# Patient Record
Sex: Female | Born: 1975
Health system: Southern US, Community
[De-identification: ages and names within clinical notes are randomized; demographics above are authoritative.]

## PROBLEM LIST (undated history)

## (undated) DIAGNOSIS — Z87440 Personal history of urinary (tract) infections: Secondary | ICD-10-CM

## (undated) DIAGNOSIS — E785 Hyperlipidemia, unspecified: Secondary | ICD-10-CM

## (undated) DIAGNOSIS — Z8619 Personal history of other infectious and parasitic diseases: Secondary | ICD-10-CM

## (undated) DIAGNOSIS — Z87442 Personal history of urinary calculi: Secondary | ICD-10-CM

## (undated) HISTORY — PX: NO PAST SURGERIES: SHX2092

## (undated) HISTORY — DX: Personal history of urinary (tract) infections: Z87.440

## (undated) HISTORY — DX: Personal history of urinary calculi: Z87.442

## (undated) HISTORY — DX: Personal history of other infectious and parasitic diseases: Z86.19

## (undated) HISTORY — DX: Hyperlipidemia, unspecified: E78.5

---

## 2002-11-18 ENCOUNTER — Encounter: Payer: Self-pay | Admitting: Internal Medicine

## 2002-11-18 ENCOUNTER — Ambulatory Visit (HOSPITAL_COMMUNITY): Admission: RE | Admit: 2002-11-18 | Discharge: 2002-11-18 | Payer: Self-pay | Admitting: Internal Medicine

## 2007-08-01 ENCOUNTER — Emergency Department: Payer: Self-pay | Admitting: Internal Medicine

## 2008-04-03 ENCOUNTER — Ambulatory Visit: Payer: Self-pay | Admitting: Internal Medicine

## 2011-04-01 NOTE — Progress Notes (Signed)
[facility]                  [facility]' OFFICE NOTE   NAME:FIDLER, ELLAR HAKALA                        MRN:          [id number]  DATE:04/03/2008                            DOB:          May 16, 1976    Lauren Mann was seen today at her own request because of problems with  palpitations.   She is a 35 year old soon-to-be-married mother of a 73-year-old who has a  1-year history of recurrent abrupt onset __________ tachypalpitations.  They typically last 3-5 minutes or so.  They are occurring every month  or two.  They are abrupt and offset, as well.  They are frog-negative.  They are not associated with shortness of breath.  There is some chest  discomfort with radiation to the left arm.  There is no accompanying  lightheadedness until the episodes are all over.   She does not notice that they are aggravated by caffeine, nor are they  precipitated by exertion.  She exercises a couple of times a week in the  [place] and has had no provokable arrhythmia nor any provokable chest  discomfort.   She also has a longstanding history of another palpitation syndrome  where she felt like her heart was either having skips or extra beats.  These were clearly aggravated by the use of decongestants and caffeine.   As noted, she has no baseline exercise intolerance.   She denies syncope, peripheral edema, nocturnal dyspnea or orthopnea.   She has no history of hypertension.   PAST MEDICAL HISTORY:  Largely negative.   SOCIAL HISTORY:  As noted above.  She smokes a pack or so of cigarettes  per day.  She has tried the electronic cigarette stopper which worked  for while.  Unfortunately, her fiance smokes two to three packs per day.  She uses alcohol occasionally.  She denies the use of recreational  drugs, and she has not been using caffeine.   She works in Clinical biochemist at [employer].   CURRENT MEDICATIONS:  1. Ibuprofen.  2. Tylenol.   ALLERGIES:  She has no known drug allergies.   PHYSICAL EXAMINATION:  GENERAL:  On examination, she is a healthy-  appearing young Caucasian female appearing her stated age of 35.  VITAL SIGNS:  Her blood pressure today was recorded, is now misplaced,  but was normal.  Her pulse was 75, respirations were 16 and she was in  no acute distress.  HEENT:  No icterus or xanthoma.  NECK:  The neck veins were flat.  The carotids were brisk and full  bilaterally without bruits.  BACK:  The back was without kyphosis or scoliosis.  LUNGS:  Clear.  HEART:  Heart sounds were regular with a broadly split S2 that did move.  There were no significant murmurs.  ABDOMEN:  The abdomen was soft with active bowel sounds without midline  pulsation or hepatomegaly.  EXTREMITIES:  Femoral pulses were 2+.  Distal pulses were intact.  There  was no clubbing, cyanosis or edema.  NEUROLOGIC:  Grossly normal.  SKIN:  Warm and dry.   ELECTROCARDIOGRAM:  The electrocardiogram today demonstrated sinus  rhythm  at 72 with intervals of 0.15/0.07/0.37.  There was no evidence of  ventricular pre-excitation.  There were some prominent R-waves, but not  sufficient to make a diagnosis of left ventricular hypertrophy.   IMPRESSION:  1. Tachypalpitations, likely supraventricular tachycardia.  2. Palpitations were likely consistent with PVCs/PACs, aggravated by      stimulants.  3. Cigarette abuse.   Ms. Majel Homer has palpitation syndromes that are likely as described above.  We discussed spontaneous maneuvers to try to augment the rapid  termination of these spells.  They have caused a minimum of significant  untoward symptoms, and, as such, she would like not to pursue drug  therapy or catheter ablation therapy, both of which we discussed  briefly.  I would concur.   As it relates to the palpitations, which are likely premature beats,  albeit more atrial than ventricle, these are vastly diminished since she  has  eliminated intake of stimulants either in the form of  medications  or coffee.   We also discussed smoking cessation.  I have encouraged her in this.  I  gave her the number for the 1-800-STOP-NOW program for both her and her  fiance.   At her request, we will see her again in six months' time.     Duke Salvia, MD, [facility]  Electronically Signed    SCK/MedQ  DD: 04/03/2008  DT: 04/03/2008  Job #: [id number]

## 2011-07-01 ENCOUNTER — Ambulatory Visit: Payer: Self-pay | Admitting: Obstetrics and Gynecology

## 2012-03-23 IMAGING — RF DG HYSTEROSALPINOGRAM W/ INJ
1 series · 1 of 1 positions shown · non-contrast
Comparison: none

REASON FOR EXAM: infertilty
COMMENTS:

[Series 1: run · 1 of 1 slices shown]
[im 1/1]
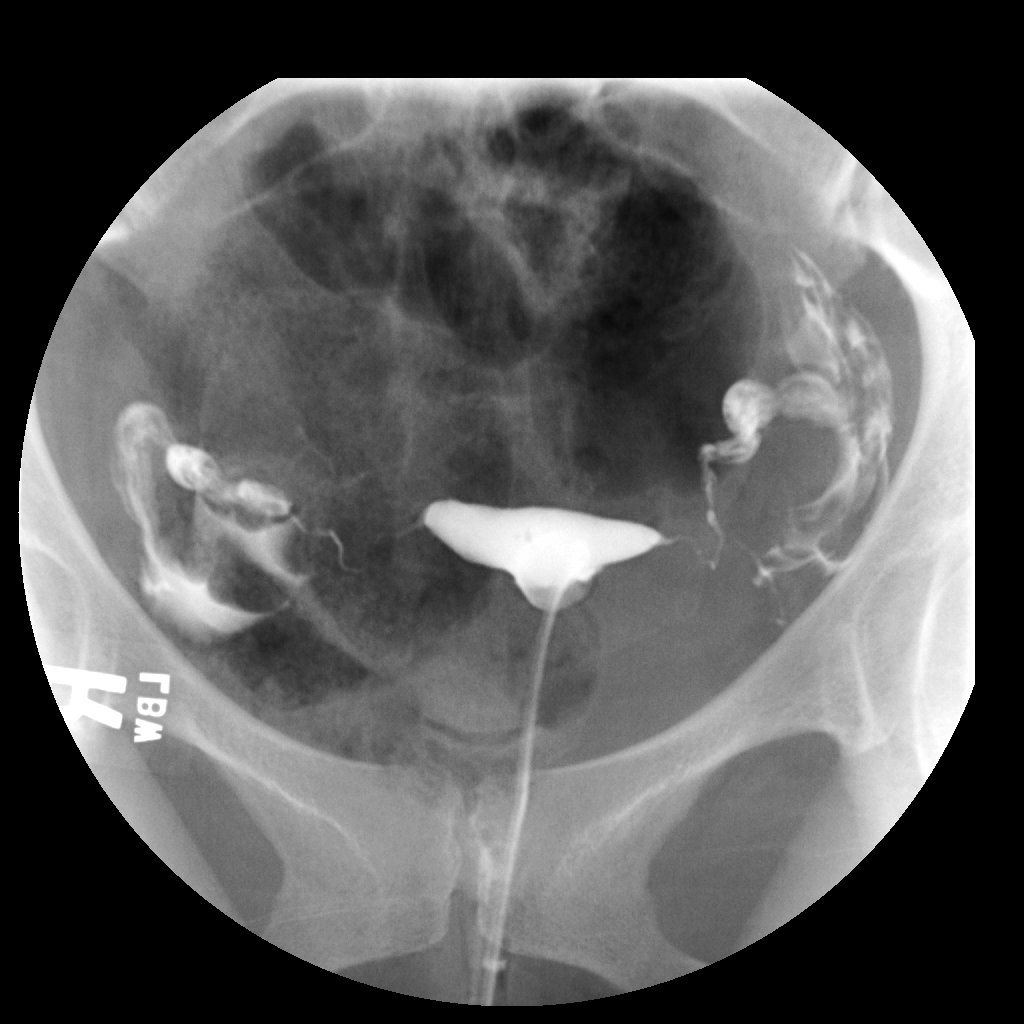

[1 of 1 positions shown; findings below may reference images not displayed]

PROCEDURE:     FL  - FL HYSTEROSALPINGOGRAM  - July 01, 2011  [DATE]

RESULT:     The patient underwent hysterosalpingography under the direction
of Dr. Ka Chon. The endocervical canal was cannulated by Dr. Ka Chon. Under
fluoroscopic guidance Asovue-[id number] was injected into the uterine cavity. There
is free spillage of contrast from normal appearing fallopian tubes. The
contour of the uterine cavity is normal.
IMPRESSION: Normal hysterosalpingogram. Further interpretation is
deferred to Dr. Ka Chon.

## 2014-12-01 ENCOUNTER — Inpatient Hospital Stay: Payer: Self-pay | Admitting: Obstetrics & Gynecology

## 2014-12-01 LAB — CBC WITH DIFFERENTIAL/PLATELET
Basophil #: 0 10*3/uL (ref 0.0–0.1)
Basophil %: 0.2 %
Eosinophil #: 0.1 10*3/uL (ref 0.0–0.7)
Eosinophil %: 1.3 %
HCT: 35.7 % (ref 35.0–47.0)
HGB: 11.7 g/dL — ABNORMAL LOW (ref 12.0–16.0)
LYMPHS PCT: 17.8 %
Lymphocyte #: 1.7 10*3/uL (ref 1.0–3.6)
MCH: 29.8 pg (ref 26.0–34.0)
MCHC: 32.8 g/dL (ref 32.0–36.0)
MCV: 91 fL (ref 80–100)
Monocyte #: 0.6 x10 3/mm (ref 0.2–0.9)
Monocyte %: 5.9 %
Neutrophil #: 7.2 10*3/uL — ABNORMAL HIGH (ref 1.4–6.5)
Neutrophil %: 74.8 %
Platelet: 201 10*3/uL (ref 150–440)
RBC: 3.93 10*6/uL (ref 3.80–5.20)
RDW: 19.7 % — ABNORMAL HIGH (ref 11.5–14.5)
WBC: 9.7 10*3/uL (ref 3.6–11.0)

## 2014-12-01 LAB — COMPREHENSIVE METABOLIC PANEL
Albumin: 2.8 g/dL — ABNORMAL LOW (ref 3.4–5.0)
Alkaline Phosphatase: 117 U/L — ABNORMAL HIGH
Anion Gap: 10 (ref 7–16)
BUN: 7 mg/dL (ref 7–18)
Bilirubin,Total: 0.3 mg/dL (ref 0.2–1.0)
CALCIUM: 9.3 mg/dL (ref 8.5–10.1)
Chloride: 105 mmol/L (ref 98–107)
Co2: 24 mmol/L (ref 21–32)
Creatinine: 0.62 mg/dL (ref 0.60–1.30)
EGFR (African American): >60
EGFR (Non-African Amer.): >60
GLUCOSE: 67 mg/dL (ref 65–99)
Osmolality: 274 (ref 275–301)
Potassium: 4.4 mmol/L (ref 3.5–5.1)
SGOT(AST): 24 U/L (ref 15–37)
SGPT (ALT): 15 U/L
Sodium: 139 mmol/L (ref 136–145)
TOTAL PROTEIN: 7.1 g/dL (ref 6.4–8.2)

## 2014-12-01 LAB — GC/CHLAMYDIA PROBE AMP

## 2014-12-02 LAB — COMPREHENSIVE METABOLIC PANEL
ALBUMIN: 2.6 g/dL — AB (ref 3.4–5.0)
ANION GAP: 13 (ref 7–16)
Alkaline Phosphatase: 118 U/L — ABNORMAL HIGH
BILIRUBIN TOTAL: 0.3 mg/dL (ref 0.2–1.0)
BUN: 8 mg/dL (ref 7–18)
Calcium, Total: 7.2 mg/dL — ABNORMAL LOW (ref 8.5–10.1)
Chloride: 104 mmol/L (ref 98–107)
Co2: 19 mmol/L — ABNORMAL LOW (ref 21–32)
Creatinine: 0.68 mg/dL (ref 0.60–1.30)
EGFR (African American): >60
EGFR (Non-African Amer.): >60
Glucose: 85 mg/dL (ref 65–99)
Osmolality: 270 (ref 275–301)
POTASSIUM: 3.3 mmol/L — AB (ref 3.5–5.1)
SGOT(AST): 25 U/L (ref 15–37)
SGPT (ALT): 16 U/L
SODIUM: 136 mmol/L (ref 136–145)
Total Protein: 6.8 g/dL (ref 6.4–8.2)

## 2014-12-02 LAB — CBC WITH DIFFERENTIAL/PLATELET
Basophil #: 0 10*3/uL (ref 0.0–0.1)
Basophil %: 0.2 %
Eosinophil #: 0.1 10*3/uL (ref 0.0–0.7)
Eosinophil %: 0.6 %
HCT: 34.2 % — ABNORMAL LOW (ref 35.0–47.0)
HGB: 11.5 g/dL — AB (ref 12.0–16.0)
Lymphocyte #: 1.4 10*3/uL (ref 1.0–3.6)
Lymphocyte %: 9.5 %
MCH: 30.3 pg (ref 26.0–34.0)
MCHC: 33.5 g/dL (ref 32.0–36.0)
MCV: 90 fL (ref 80–100)
Monocyte #: 0.7 x10 3/mm (ref 0.2–0.9)
Monocyte %: 4.6 %
Neutrophil #: 12.8 10*3/uL — ABNORMAL HIGH (ref 1.4–6.5)
Neutrophil %: 85.1 %
Platelet: 203 10*3/uL (ref 150–440)
RBC: 3.79 10*6/uL — ABNORMAL LOW (ref 3.80–5.20)
RDW: 20 % — ABNORMAL HIGH (ref 11.5–14.5)
WBC: 15.1 10*3/uL — ABNORMAL HIGH (ref 3.6–11.0)

## 2014-12-03 LAB — HEMATOCRIT: HCT: 36.2 % (ref 35.0–47.0)

## 2014-12-04 LAB — BETA STREP CULTURE(ARMC)

## 2015-03-12 LAB — SURGICAL PATHOLOGY

## 2015-03-27 NOTE — H&P (Signed)
L&D Evaluation:  History:  HPI 38yo G2p1001 at 35.5 with EDC of 12/31/14 by LMP c/w  2nd trimester ultrasound.  Was seen in clinic yesterday with mild range BPs, and a P/C ratio of .326.  Today she was seen for a repeat BP and was 162/98 and was sent to triage for evauation.  She denies HA, change of vision, SOB or RUQ pain.  + occasional non-painful contractions, +FM, no LOF of VB.    With continued evaluation in triage, she has had 4x SBPs >160, in >4hrs time.  None have been persistently high in more than 15min incriments.    Pregnancy issues: 1. unsure preterm delivery of G1 (1999).  possibly at 36 weeks. patient declined progesterone 2. AMA (38y) 3. echogenic foci of left ventricle, informaseq neg 4. allergy to caffeine 5. B neg / RI / VI / 1hr 101 / Hb 9.8 / GBS not done   Presents with pre-eclampsia   Patient's Medical History No Chronic Illness   Patient's Surgical History none   Medications Pre Natal Vitamins  Iron   Allergies caffeine   Social History none   Family History Non-Contributory   ROS:  ROS All systems were reviewed.  HEENT, CNS, GI, GU, Respiratory, CV, Renal and Musculoskeletal systems were found to be normal.   Exam:  Vital Signs BP >140/90  afebrile.  BPs: 141-168/79-94   Urine Protein p/c ratio 326   General no apparent distress   Mental Status clear   Chest clear   Heart normal sinus rhythm, no murmur/gallop/rubs   Abdomen gravid, non-tender   Estimated Fetal Weight Average for gestational age   Fetal Position cephalic by leopolds   Back no CVAT   Edema 1+  Pitting  to mid-calf bilaterally   Reflexes 2+   Clonus negative   Pelvic 2/70/-1, soft, anterior   Mebranes Intact   FHT normal rate with no decels, 140 mod +accels no decels   Fetal Heart Rate 140   Ucx irregular   Skin dry   Lymph no lymphadenopathy   Other ALT/AST/PLT all WNL Cr. 0.68   Impression:  Impression pre-eclampsia with severe features    Plan:  Comments 38yo G2P1001 @ 35.5 with pre-eclampsia with severe features.  1. Admit to L&D for induction of labor 2. Magnesium sulfate for seizure prophylaxis, 4g load with 2g hourly dose. Foley catheter with 1hr I/O record. 3. IVF= LR @ 75cc/hr 4. GBS unknown - collected and sent earlier, await results.  PCN 5million load and 2.175million maint dose q4h until results known, if neg d/c if pos continue.  Notify SCN that treatment was given. 5. Labs still needed:  T&S (B neg) and RPR 6. IOL with pitocin per protocol, Bishop score = 9. 7. Category 1 FHT tracing 8. Clear diet 9. Rh Ig post partum   Electronic Signatures: Ward, Elenora Fenderhelsea C (MD)  (Signed 15-Jan-16 20:50)  Authored: L&D Evaluation   Last Updated: 15-Jan-16 20:50 by Ward, Elenora Fenderhelsea C (MD)

## 2015-12-10 ENCOUNTER — Ambulatory Visit (INDEPENDENT_AMBULATORY_CARE_PROVIDER_SITE_OTHER): Payer: 59 | Admitting: Family Medicine

## 2015-12-10 ENCOUNTER — Encounter: Payer: Self-pay | Admitting: Family Medicine

## 2015-12-10 VITALS — BP 104/72 | HR 103 | Temp 98.2°F | Ht 65.0 in | Wt 131.8 lb

## 2015-12-10 DIAGNOSIS — R05 Cough: Secondary | ICD-10-CM

## 2015-12-10 DIAGNOSIS — R059 Cough, unspecified: Secondary | ICD-10-CM | POA: Insufficient documentation

## 2015-12-10 DIAGNOSIS — Z0000 Encounter for general adult medical examination without abnormal findings: Secondary | ICD-10-CM | POA: Insufficient documentation

## 2015-12-10 MED ORDER — HYDROCOD POLST-CPM POLST ER 10-8 MG/5ML PO SUER: 5.0000 mL | Freq: Two times a day (BID) | ORAL | Status: DC | PRN

## 2015-12-10 MED ORDER — PREDNISONE 50 MG PO TABS: ORAL_TABLET | ORAL | Status: DC

## 2015-12-10 NOTE — Progress Notes (Signed)
Subjective:  Patient ID: [name], female    DOB: 02-11-1976  Age: 40 y.o. MRN: [id number]  CC: New patient; Cough  HPI Hallelujah L Deleon is a 40 y.o. female presents to the clinic today as a new patient with the following complaints/issues.  Patient states that she has not felt well since Saturday night. She been expressing a dry cough and chest congestion. She reports associated chest discomfort. She been using Mucinex with no improvement. No associated fevers or chills. No shortness of breath. Chest pain is exacerbated by cough. No relieving factors. She has had known sick contacts as her son is also sick.  PMH, Surgical Hx, Family Hx, Social History reviewed and updated as below.  Past Medical History  Diagnosis Date  . History of kidney stones   . Hyperlipidemia   . History of UTI   . History of chicken pox     Past Surgical History  Procedure Laterality Date  . No past surgeries      Family History  Problem Relation Age of Onset  . Hyperlipidemia Father   . Heart disease Father   . Mental illness Sister   . Breast cancer Paternal Aunt   . Arthritis Maternal Grandmother   . Kidney disease Maternal Grandmother   . Diabetes Maternal Grandmother   . Heart disease Paternal Grandfather      Social History  Substance Use Topics  . Smoking status: Former Smoker    Quit date: 12/09/2008  . Smokeless tobacco: Never Used  . Alcohol Use: 0.0 - 0.6 oz/week    0-1 Standard drinks or equivalent per week   Review of Systems  HENT: Positive for congestion.   Respiratory: Positive for cough and chest tightness.   Psychiatric/Behavioral:       Anxiety.  All other systems reviewed and are negative.  Objective:   Today's Vitals: BP 104/72 mmHg  Pulse 103  Temp(Src) 98.2 F (36.8 C) (Oral)  Ht  (1.651 m)  Wt 131 lb 12 oz (59.761 kg)  BMI 21.92 kg/m2  SpO2 98%  LMP 11/19/2015  Physical Exam  Constitutional: She is oriented to person, place, and time. She appears  well-developed. No distress.  HENT:  Head: Normocephalic and atraumatic.  Right TM obscured by cerumen. Left TM normal. Oropharynx with mild erythema. No exudate.  Eyes: Conjunctivae are normal. Right eye exhibits no discharge. Left eye exhibits no discharge.  Neck: Normal range of motion.  Cardiovascular: Regular rhythm.  Tachycardia present.   Pulmonary/Chest: Effort normal and breath sounds normal. No respiratory distress. She has no wheezes. She has no rales.  Abdominal: Soft. She exhibits no distension.  Musculoskeletal: Normal range of motion.  Neurological: She is alert and oriented to person, place, and time.  Skin: Skin is warm and dry.  Psychiatric: She has a normal mood and affect.  Vitals reviewed.  Assessment & Plan:   Problem List Items Addressed This Visit    Cough - Primary    New problem, severe per report. Likely viral in origin. Treating with prednisone and Tussionex given severity of symptoms.         Outpatient Encounter Prescriptions as of 12/10/2015  Medication Sig  . chlorpheniramine-HYDROcodone (TUSSIONEX PENNKINETIC ER) 10-8 MG/5ML SUER Take 5 mLs by mouth every 12 (twelve) hours as needed.  . predniSONE (DELTASONE) 50 MG tablet 1 tablet daily x 5 days.   No facility-administered encounter medications on file as of 12/10/2015.    Follow-up: Annually  [employer]  DO [facility] [employer]

## 2015-12-10 NOTE — Progress Notes (Signed)
Pre visit review using our clinic review tool, if applicable. No additional management support is needed unless otherwise documented below in the visit note. 

## 2015-12-10 NOTE — Patient Instructions (Signed)
Take the prednisone as prescribed.  Use the cough syrup as directed.  Use Ibuprofen for you discomfort.  Follow up annually.  Take care  Dr. Adriana Simas

## 2015-12-10 NOTE — Assessment & Plan Note (Addendum)
New problem, severe per report. Likely viral in origin. Treating with prednisone and Tussionex given severity of symptoms.

## 2017-09-07 DIAGNOSIS — S40862A Insect bite (nonvenomous) of left upper arm, initial encounter: Secondary | ICD-10-CM | POA: Diagnosis not present

## 2017-09-07 DIAGNOSIS — S90561A Insect bite (nonvenomous), right ankle, initial encounter: Secondary | ICD-10-CM | POA: Diagnosis not present

## 2017-09-07 DIAGNOSIS — S40861A Insect bite (nonvenomous) of right upper arm, initial encounter: Secondary | ICD-10-CM | POA: Diagnosis not present

## 2017-09-30 DIAGNOSIS — J069 Acute upper respiratory infection, unspecified: Secondary | ICD-10-CM | POA: Diagnosis not present

## 2018-05-21 ENCOUNTER — Telehealth: Payer: Self-pay

## 2018-05-21 NOTE — Telephone Encounter (Signed)
Pt called triage left a message that she had a positive pregnancy test, the 1st day of her last period was  June 3rd. She is now having some pink discharge with bright red spotting, she has on a panty liner. Also reports pain in her lower back that's not going away, with a little cramping in her stomach. What should she do or look out for? Please advise

## 2018-05-21 NOTE — Treatment Plan (Signed)
Discussed spotting with patient and trending beta hCG. She will call for appointment early next week as needed.

## 2018-05-24 ENCOUNTER — Encounter: Payer: Self-pay | Admitting: Certified Nurse Midwife

## 2018-05-24 ENCOUNTER — Ambulatory Visit (INDEPENDENT_AMBULATORY_CARE_PROVIDER_SITE_OTHER): Payer: 59 | Admitting: Certified Nurse Midwife

## 2018-05-24 ENCOUNTER — Ambulatory Visit (INDEPENDENT_AMBULATORY_CARE_PROVIDER_SITE_OTHER): Payer: 59

## 2018-05-24 VITALS — BP 124/80 | HR 78 | Ht 65.0 in | Wt 133.0 lb

## 2018-05-24 DIAGNOSIS — Z6791 Unspecified blood type, Rh negative: Secondary | ICD-10-CM

## 2018-05-24 DIAGNOSIS — O209 Hemorrhage in early pregnancy, unspecified: Secondary | ICD-10-CM | POA: Diagnosis not present

## 2018-05-24 DIAGNOSIS — N8311 Corpus luteum cyst of right ovary: Secondary | ICD-10-CM | POA: Diagnosis not present

## 2018-05-24 DIAGNOSIS — O3411 Maternal care for benign tumor of corpus uteri, first trimester: Secondary | ICD-10-CM

## 2018-05-24 DIAGNOSIS — O26891 Other specified pregnancy related conditions, first trimester: Principal | ICD-10-CM

## 2018-05-24 MED ORDER — RHO D IMMUNE GLOBULIN 1500 UNIT/2ML IJ SOSY
300.0000 ug | PREFILLED_SYRINGE | Freq: Once | INTRAMUSCULAR | Status: AC
  Administered 2018-05-24: 300 ug via INTRAMUSCULAR

## 2018-05-24 NOTE — Progress Notes (Signed)
Obstetrics & Gynecology Office Visit   Chief Complaint:  Chief Complaint  Patient presents with  . Gynecologic Exam    possible miscarriage, bleeding like a period    History of Present Illness: 42 year old G3 P0202 with LMP =04/19/2018 and a positive home pregnancy test on 05/19/2018 presents at [redacted] weeks gestation with complaints of bleeding and cramping since 7/5, increasing in flow and cramping yesterday. Denies passing tissue or clots. Menses have been every 26 days apart for the last 3 months (were every 28-29 days apart prior to that) and her last menses was normal. She reports being B negative blood type. History  Of 3 previous preterm vaginal deliveries at 36 weeks. Not using contraception. Taking vitamins.   Review of Systems:  Review of Systems  Constitutional: Positive for malaise/fatigue. Negative for chills, fever and weight loss.       Positive for change in appetite  HENT: Positive for congestion. Negative for sinus pain and sore throat.   Eyes: Negative for blurred vision and pain.  Respiratory: Positive for shortness of breath. Negative for hemoptysis and wheezing.   Cardiovascular: Negative for chest pain, palpitations and leg swelling.  Gastrointestinal: Positive for diarrhea. Negative for abdominal pain, blood in stool, heartburn, nausea and vomiting.  Genitourinary: Positive for frequency. Negative for dysuria, hematuria and urgency.       Positive for vaginal bleeding  Musculoskeletal: Negative for back pain, joint pain and myalgias.       Positive for muscle weakness  Skin: Negative for itching and rash.  Neurological: Positive for dizziness and headaches. Negative for tingling.  Endo/Heme/Allergies: Positive for environmental allergies (and sneezing). Negative for polydipsia. Does not bruise/bleed easily.       Negative for hirsutism. Positive for heat and cold intolerance   Psychiatric/Behavioral: Negative for depression. The patient is nervous/anxious. The  patient does not have insomnia.     Breasts: positive for breast tenderness   Past Medical History:  Past Medical History:  Diagnosis Date  . History of chicken pox   . History of kidney stones   . History of UTI   . Hyperlipidemia     Past Surgical History:  Past Surgical History:  Procedure Laterality Date  . NO PAST SURGERIES      Gynecologic History: Patient's last menstrual period was 04/19/2018.  Obstetric History: G3P0202  Family History:  Family History  Problem Relation Age of Onset  . Hyperlipidemia Father   . Heart disease Father   . Mental illness Sister   . Breast cancer Paternal Aunt   . Arthritis Maternal Grandmother   . Kidney disease Maternal Grandmother   . Diabetes Maternal Grandmother   . Heart disease Paternal Grandfather     Social History:  Social History   Socioeconomic History  . Marital status: Married    Spouse name: Not on file  . Number of children: 2  . Years of education: Not on file  . Highest education level: Not on file  Occupational History  . Not on file  Social Needs  . Financial resource strain: Not on file  . Food insecurity:    Worry: Not on file    Inability: Not on file  . Transportation needs:    Medical: Not on file    Non-medical: Not on file  Tobacco Use  . Smoking status: Former Smoker    Last attempt to quit: 12/09/2008    Years since quitting: 9.4  . Smokeless tobacco: Never Used  Substance and Sexual Activity  . Alcohol use: Not Currently    Alcohol/week: 0.0 - 0.6 oz    Comment: very rare  . Drug use: Not Currently    Types: Marijuana    Comment: when she was younger  . Sexual activity: Yes    Birth control/protection: None  Lifestyle  . Physical activity:    Days per week: Not on file    Minutes per session: Not on file  . Stress: Not on file  Relationships  . Social connections:    Talks on phone: Not on file    Gets together: Not on file    Attends religious service: Not on file     Active member of club or organization: Not on file    Attends meetings of clubs or organizations: Not on file    Relationship status: Not on file  . Intimate partner violence:    Fear of current or ex partner: Not on file    Emotionally abused: Not on file    Physically abused: Not on file    Forced sexual activity: Not on file  Other Topics Concern  . Not on file  Social History Narrative  . Not on file    Allergies:  Allergies  Allergen Reactions  . Caffeine Palpitations    Medications: Prior to Admission medications   Not on File    Physical Exam Vitals: BP 124/80   Pulse 78   Ht 5\' 5"  (1.651 m)   Wt 133 lb (60.3 kg)   LMP 04/19/2018   BMI 22.13 kg/m  Physical Exam  Constitutional: She is oriented to person, place, and time. She appears well-developed and well-nourished. No distress.  Cardiovascular: Normal rate.  Respiratory: Effort normal.  GI: Soft. She exhibits no distension and no mass. There is no tenderness. There is no guarding.  Genitourinary:  Genitourinary Comments: Vulva: no lesions or inflammation Vagina: small amount of blood in vault Cervix: blood coming from os, no tissue in os, closed Uterus: multiparous, NSSC, mobile, NT, AF Adnexa: no masses, NT  Musculoskeletal: Normal range of motion.  Neurological: She is alert and oriented to person, place, and time.  Skin: Skin is warm and dry.  Psychiatric: She has a normal mood and affect. Her behavior is normal. Judgment normal.   Review of prenatal records confirms patient is B negative  Pelvic ultrasound today: Gestational sac and fetal pole is not seen. Endometrium measures 7.56 mm  Right Ovary is normal in appearance. Left Ovary is normal appearance. Corpus luteal cyst:  Right ovary Survey of the adnexa demonstrates no adnexal masses. There is no free peritoneal fluid in the cul de sac.    Assessment: 42 y.o. [id number] with probable early pregnancy loss/ SAB AMA RH negative  Plan:  ABO/RH and antibody screen done today Received Rhogam after blood draw Will call with results of labs Discussed increased risk of SAB with AMA  To let us know if bleeding does not taper off like a menses.  Farrel Connersolleen Anaijah Augsburger, CNM

## 2018-05-25 LAB — ABO/RH: RH TYPE: NEGATIVE

## 2018-05-25 LAB — BETA HCG QUANT (REF LAB): hCG Quant: 19 m[IU]/mL

## 2018-05-26 ENCOUNTER — Telehealth: Payer: Self-pay | Admitting: Certified Nurse Midwife

## 2018-05-26 DIAGNOSIS — O209 Hemorrhage in early pregnancy, unspecified: Secondary | ICD-10-CM

## 2018-05-26 NOTE — Telephone Encounter (Signed)
Calling with beta HCG results=19. Most likely having an early SAB. Bleeding and cramping are about gone. Advised to have a repeat beta next week. Farrel Connersolleen Lucielle Vokes, CNM

## 2018-05-28 NOTE — Telephone Encounter (Addendum)
Pt reports not feeling well today. She is really dizzy/light headed. Having heavier bleeding today w/cramping & pain. Bleeding had tapered off but has gotten heavy again. Inquiring if normal.   Patient called and stated having to change a tampon three times since yesterday at 4 PM. Having some cramping. Is at work. Advised to take some Motrin or Tylenol and sit down and rest. Does not feel like she can go home from work. Does have the weekend off to rest. Farrel Connersolleen Gutierrez, CNM

## 2018-05-31 ENCOUNTER — Other Ambulatory Visit: Payer: 59

## 2018-05-31 DIAGNOSIS — O209 Hemorrhage in early pregnancy, unspecified: Secondary | ICD-10-CM

## 2018-06-01 LAB — BETA HCG QUANT (REF LAB): hCG Quant: 1 m[IU]/mL

## 2018-06-02 ENCOUNTER — Telehealth: Payer: Self-pay | Admitting: Certified Nurse Midwife

## 2018-06-02 NOTE — Telephone Encounter (Signed)
PAtient called with results of quant beta HCG: negative (<5). She reports that her bleeding is now stopped. Advised to remain on prenatal vitamins as she is at risk for pregnancy. Farrel Connersolleen Jasdeep Dejarnett, CNM

## 2018-12-07 ENCOUNTER — Encounter: Payer: Self-pay | Admitting: Advanced Practice Midwife

## 2018-12-07 ENCOUNTER — Ambulatory Visit (INDEPENDENT_AMBULATORY_CARE_PROVIDER_SITE_OTHER): Payer: 59 | Admitting: Advanced Practice Midwife

## 2018-12-07 VITALS — BP 118/74 | Ht 65.0 in | Wt 143.0 lb

## 2018-12-07 DIAGNOSIS — N926 Irregular menstruation, unspecified: Secondary | ICD-10-CM

## 2018-12-08 ENCOUNTER — Encounter: Payer: Self-pay | Admitting: Advanced Practice Midwife

## 2018-12-08 NOTE — Progress Notes (Signed)
Patient ID: Lauren Mann, female   DOB: 1976-04-06, 43 y.o.   MRN: [id number]  Reason for Visit: Menstrual Problem    Subjective:     HPI  Lauren Mann is a 43 y.o. female in the office today to discuss a recently missed period. She normally has a 27-29 day cycle that lasts for 5 days and is heavy gradually decreasing to normal flow. She has not taken birth control hormones for the past 8 years. She uses nothing for birth control and has a "if it happens it happens" attitude. Her last menstrual period was 10/29/18. Around day 27 of the cycle (11/25/18) when she was due to start a period, she had some very light pink spotting. Then last week she had cramping that reminded her of ovulation rather than a period. She denies any recent increase in stress or extreme exercise or any other major changes in lifestyle. She does want to start a Keto diet. Discussed focus on eating a variety of healthy foods. Discussed possibility of hormone changes of perimenopause. Her last PAP smear was in 2015. She is due for an annual exam and will schedule that. She agrees to wait for 3-4 more weeks to see if she will have a normal cycle prior to any further hormone work up.  Past Medical History:  Diagnosis Date  . History of chicken pox   . History of kidney stones   . History of UTI   . Hyperlipidemia    Family History  Problem Relation Age of Onset  . Hyperlipidemia Father   . Heart disease Father   . Mental illness Sister   . Breast cancer Paternal Aunt   . Arthritis Maternal Grandmother   . Kidney disease Maternal Grandmother   . Diabetes Maternal Grandmother   . Heart disease Paternal Grandfather    Past Surgical History:  Procedure Laterality Date  . NO PAST SURGERIES      Short Social History:  Social History   Tobacco Use  . Smoking status: Former Smoker    Last attempt to quit: 12/09/2008    Years since quitting: 10.0  . Smokeless tobacco: Never Used  Substance Use Topics  . Alcohol use:  Not Currently    Alcohol/week: 0.0 - 1.0 standard drinks    Comment: very rare    Allergies  Allergen Reactions  . Caffeine Palpitations    No current outpatient medications on file.   No current facility-administered medications for this visit.     Review of Systems  Constitutional:  Constitutional negative. HENT: HENT negative.  Eyes: Eyes negative.  Respiratory: Respiratory negative.  Cardiovascular: Cardiovascular negative.  GI: Gastrointestinal negative.  GU:       Missed menstrual cycle Musculoskeletal: Musculoskeletal negative.  Skin: Skin negative.  Neurological: Neurological negative. Hematologic: Hematologic/lymphatic negative.  Psychiatric: Psychiatric negative.        Objective:  Objective   Vitals:   12/07/18 1612  BP: 118/74  Weight: 143 lb (64.9 kg)  Height: 5\' 5"  (1.651 m)   Body mass index is 23.8 kg/m.  Constitutional: Well nourished, well developed female in no acute distress.  HEENT: normal Skin: Warm and dry.   Respiratory:  Normal respiratory effort Psych: Alert and Oriented x3. No memory deficits. Normal mood and affect.   Data: POCT urine pregnancy test: negative     Assessment/Plan:     43 year old G2 P0202 female with missed period, pregnancy test negative  Return to clinic PRN for further irregular  menstrual cycles/lab follow up, and for annual exam with PAP smear     [name] CNM [facility], [facility]

## 2018-12-08 NOTE — Patient Instructions (Signed)

## 2018-12-16 ENCOUNTER — Ambulatory Visit: Payer: 59 | Admitting: Advanced Practice Midwife

## 2020-11-17 NOTE — L&D Delivery Note (Signed)
Delivery Note Called to theh room at 0810, and At  0821,a viable female was delivered via  vaginal birth, presenting LOA, with restitution to LOT.  APGAR:9 , 9; weight pending .   Placenta status: complete, delivered at 0836  ,  .  Cord:  3 vessel with the following complications: none .    Anesthesia:  epidural Episiotomy:  none Lacerations:  small first degree perineal laceration Suture Repair: 3.0 vicryl rapide Est. Blood Loss (mL):  275  Mom to postpartum. Remains on magnesium for 24 hours due to pre eclampsia and some severe blood pressures Baby to Couplet care / Skin to Skin.  Mirna Mires 10/14/2021, 8:39 AM

## 2021-03-07 ENCOUNTER — Telehealth: Payer: Self-pay

## 2021-03-07 ENCOUNTER — Other Ambulatory Visit: Payer: Self-pay | Admitting: Obstetrics & Gynecology

## 2021-03-07 MED ORDER — ACYCLOVIR 200 MG/5ML PO SUSP: 200.0000 mg | Freq: Every day | ORAL | 0 refills | Status: DC

## 2021-03-07 MED ORDER — ACYCLOVIR 200 MG PO CAPS: 200.0000 mg | ORAL_CAPSULE | Freq: Every day | ORAL | 0 refills | Status: DC

## 2021-03-07 NOTE — Telephone Encounter (Signed)
Pt calling; is around 6wks preg; has a horrible scratchy throat; sneezing; lots of nose blowing; congestion.  [phone]  Adv gargle as many times as a day as she would like c warm salt water; chloraseptic spray, sucrets or halls cough drops; plain sudafed; e.s. tylenol; sip hot beverages/soup but watch caffeine intake as you are only allowed 16oz of caffeine a day including choc.  Pt inquired about what to do for a cold sore. Pt aware PH has rx'd acyclovir.

## 2021-03-19 ENCOUNTER — Ambulatory Visit (INDEPENDENT_AMBULATORY_CARE_PROVIDER_SITE_OTHER): Payer: 59 | Admitting: Obstetrics and Gynecology

## 2021-03-19 ENCOUNTER — Encounter: Payer: Self-pay | Admitting: Obstetrics and Gynecology

## 2021-03-19 ENCOUNTER — Other Ambulatory Visit (HOSPITAL_COMMUNITY)
Admission: RE | Admit: 2021-03-19 | Discharge: 2021-03-19 | Disposition: A | Discharge status: Home / Self Care | Payer: 59 | Source: Ambulatory Visit | Attending: Obstetrics and Gynecology | Admitting: Obstetrics and Gynecology

## 2021-03-19 ENCOUNTER — Other Ambulatory Visit: Payer: Self-pay

## 2021-03-19 VITALS — BP 118/78 | HR 82 | Wt 147.0 lb

## 2021-03-19 DIAGNOSIS — Z124 Encounter for screening for malignant neoplasm of cervix: Secondary | ICD-10-CM

## 2021-03-19 DIAGNOSIS — Z113 Encounter for screening for infections with a predominantly sexual mode of transmission: Secondary | ICD-10-CM | POA: Diagnosis not present

## 2021-03-19 DIAGNOSIS — O0992 Supervision of high risk pregnancy, unspecified, second trimester: Secondary | ICD-10-CM | POA: Insufficient documentation

## 2021-03-19 DIAGNOSIS — Z3201 Encounter for pregnancy test, result positive: Secondary | ICD-10-CM | POA: Diagnosis not present

## 2021-03-19 DIAGNOSIS — O09299 Supervision of pregnancy with other poor reproductive or obstetric history, unspecified trimester: Secondary | ICD-10-CM | POA: Insufficient documentation

## 2021-03-19 DIAGNOSIS — N912 Amenorrhea, unspecified: Secondary | ICD-10-CM

## 2021-03-19 DIAGNOSIS — O0990 Supervision of high risk pregnancy, unspecified, unspecified trimester: Secondary | ICD-10-CM | POA: Insufficient documentation

## 2021-03-19 DIAGNOSIS — Z3149 Encounter for other procreative investigation and testing: Secondary | ICD-10-CM

## 2021-03-19 DIAGNOSIS — Z7185 Encounter for immunization safety counseling: Secondary | ICD-10-CM

## 2021-03-19 DIAGNOSIS — O281 Abnormal biochemical finding on antenatal screening of mother: Secondary | ICD-10-CM

## 2021-03-19 DIAGNOSIS — O0991 Supervision of high risk pregnancy, unspecified, first trimester: Secondary | ICD-10-CM

## 2021-03-19 LAB — POCT URINALYSIS DIPSTICK OB
Appearance: ABNORMAL
Bilirubin, UA: NEGATIVE
Blood, UA: NEGATIVE
Glucose, UA: NEGATIVE
Ketones, UA: NEGATIVE
Nitrite, UA: NEGATIVE
Odor: NORMAL
POC,PROTEIN,UA: NEGATIVE
Spec Grav, UA: 1.015 (ref 1.010–1.025)
Urobilinogen, UA: 0.2 E.U./dL
pH, UA: 6 (ref 5.0–8.0)

## 2021-03-19 LAB — POCT URINE PREGNANCY: Preg Test, Ur: POSITIVE — AB

## 2021-03-19 NOTE — Progress Notes (Signed)
New Obstetric Patient H&P    Chief Complaint: Missed period, +home UPT   History of Present Illness: Patient is a 45 y.o. [id number] Not Hispanic or Latino female, presents with amenorrhea and positive home pregnancy test. Patient's last menstrual period was 01/28/2021. and based on her  LMP, her EDD is Estimated Date of Delivery: 11/04/21 and her EGA is [redacted]w[redacted]d. Cycles are 4-5 days, regular, and occur approximately every 29 days. Her last pap smear was 6 years ago and was NILM.    She had a urine pregnancy test which was positive 3 or 4 week(s)  ago. Her last menstrual period was normal and lasted for  4 or 5 day(s). Since her LMP she claims she has experienced an increase in breast tenderness, fatigue, bloating, nausea, and food aversion. She denies vaginal bleeding. Her past medical history is noncontributory. Her prior pregnancies are notable for two preterm deliveries. She reports that her first (22 years ago) she went into spontaneous preterm labor. With her second she was induced for severe pre-E and recieved magnesium sulfate. Her pelvis has been tested to 5lb 3oz.  Since her LMP, she admits to the use of tobacco products  no She claims she has gained   7 pounds since the start of her pregnancy.  There are cats in the home in the home  yes If yes Outdoor She admits close contact with children on a regular basis  yes  She has had chicken pox in the past yes She has had Tuberculosis exposures, symptoms, or previously tested positive for TB   no Current or past history of domestic violence. no  Genetic Screening/Teratology Counseling: (Includes patient, baby's father, or anyone in either family with:)   1. Patient's age >/= 44 at [facility] yes 2. Thalassemia ([place], [place], Mediterranean, or Asian background): MCV<80  no 3. Neural tube defect (meningomyelocele, spina bifida, anencephaly)  no 4. Congenital heart defect  no  5. Down syndrome  no 6. Tay-Sachs (Jewish, [place]  no 7.  Canavan's Disease  no 8. Sickle cell disease or trait (African)  no  9. Hemophilia or other blood disorders  no  10. Muscular dystrophy  no  11. Cystic fibrosis  no  12. Huntington's Chorea  no  13. Mental retardation/autism  no 14. Other inherited genetic or chromosomal disorder  no 15. Maternal metabolic disorder (DM, PKU, etc)  no 16. Patient or FOB with a child with a birth defect not listed above no  16a. Patient or FOB with a birth defect themselves no 17. Recurrent pregnancy loss, or stillbirth  no  18. Any medications since LMP other than prenatal vitamins (include vitamins, supplements, OTC meds, drugs, alcohol)  no 19. Any other genetic/environmental exposure to discuss  no  Infection History:   1. Lives with someone with TB or TB exposed  no  2. Patient or partner has history of genital herpes  no 3. Rash or viral illness since LMP  no 4. History of STI (GC, CT, HPV, syphilis, HIV)  no 5. History of recent travel :  no  Other pertinent information:  Lives with husband Weston Brass and 95 yo.  Review of Systems:10 point review of systems negative unless otherwise noted in HPI  Past Medical History:  Patient Active Problem List   Diagnosis Date Noted  . Supervision of high risk pregnancy in first trimester 03/19/2021     Nursing Staff Provider  Office Location  [facility] Dating   LMP  Language  English Anatomy  US    Flu Vaccine   declined Genetic Screen  NIPS:  Desires  TDaP vaccine    Hgb A1C or  GTT Early : n/a Third trimester :   Rhogam      LAB RESULTS   Feeding Plan  breast Blood Type     Contraception  Antibody    Circumcision  Rubella    Pediatrician   RPR     Support Person  Weston Brass - husband HBsAg     Prenatal Classes  HIV      Varicella @varicellaresultconsole @   BTL Consent  n/a GBS  (For PCN allergy, check sensitivities)        VBAC Consent  n/a Pap  collected 03/19/21    Hgb Electro   n/a    CF      SMA             . History of pre-eclampsia in prior  pregnancy, currently pregnant 03/19/2021    Past Surgical History:  Past Surgical History:  Procedure Laterality Date  . NO PAST SURGERIES      Gynecologic History: Patient's last menstrual period was 01/28/2021.  Obstetric History: 01/30/2021  Family History:  Family History  Problem Relation Age of Onset  . Hyperlipidemia Father   . Heart disease Father   . Mental illness Sister   . Breast cancer Paternal Aunt   . Arthritis Maternal Grandmother   . Kidney disease Maternal Grandmother   . Diabetes Maternal Grandmother   . Heart disease Paternal Grandfather     Social History:  Social History   Socioeconomic History  . Marital status: Married    Spouse name: [id number]  . Number of children: 2  . Years of education: Not on file  . Highest education level: Not on file  Occupational History  . Not on file  Tobacco Use  . Smoking status: Former Smoker    Quit date: 12/09/2008    Years since quitting: 12.2  . Smokeless tobacco: Never Used  Vaping Use  . Vaping Use: Never used  Substance and Sexual Activity  . Alcohol use: Not Currently    Alcohol/week: 0.0 - 1.0 standard drinks    Comment: very rare  . Drug use: Not Currently    Types: Marijuana    Comment: when she was younger  . Sexual activity: Yes    Birth control/protection: None  Other Topics Concern  . Not on file  Social History Narrative  . Not on file   Social Determinants of Health   Financial Resource Strain: Not on file  Food Insecurity: Not on file  Transportation Needs: Not on file  Physical Activity: Not on file  Stress: Not on file  Social Connections: Not on file  Intimate Partner Violence: Not on file    Allergies:  Allergies  Allergen Reactions  . Caffeine Palpitations    Medications: Prior to Admission medications   Medication Sig Start Date End Date Taking? Authorizing Provider  acyclovir (ZOVIRAX) 200 MG capsule Take 1 capsule (200 mg total) by mouth 5 (five) times daily. 03/07/21   Yes 03/09/21, MD  Prenatal Vit-Fe Fumarate-FA (MULTIVITAMIN-PRENATAL) 27-0.8 MG TABS tablet Take 1 tablet by mouth daily at 12 noon.   Yes [provider]    Physical Exam Vitals: Blood pressure 118/78, pulse 82, weight 147 lb (66.7 kg), last menstrual period 01/28/2021.  General: NAD HEENT: normocephalic, anicteric Thyroid: no enlargement, no palpable nodules Pulmonary: No increased work of breathing, CTAB Cardiovascular: RRR,  distal pulses 2+ Abdomen: NABS, soft, non-tender, non-distended.  Umbilicus without lesions.  No hepatomegaly, splenomegaly or masses palpable. No evidence of hernia  Genitourinary:  External: Normal external female genitalia.  Normal urethral meatus, normal  Bartholin's and Skene's glands.    Vagina: Normal vaginal mucosa, no evidence of prolapse.    Cervix: Grossly normal in appearance, no bleeding  Bimanual deferred  Rectal: deferred Extremities: no edema, erythema, or tenderness Neurologic: Grossly intact Psychiatric: mood appropriate, affect full  Assessment: 45 y.o. [id number] at [redacted]w[redacted]d presenting to initiate prenatal care  Plan: 1) Avoid alcoholic beverages. 2) Patient encouraged not to smoke.  3) Discontinue the use of all non-medicinal drugs and chemicals.  4) Take prenatal vitamins daily.  5) Nutrition, food safety (fish, cheese advisories, and high nitrite foods) and exercise discussed. 6) Hospital and practice style discussed with cross coverage system.  7) Genetic Screening, such as with 1st Trimester Screening, cell free fetal DNA, AFP testing, and Ultrasound, as well as with amniocentesis and CVS as appropriate, is discussed with patient. At the conclusion of today's visit patient requested genetic testing - Inheritest today - NIPS at next visit  8) NOB labs/preE labs/Pap/GC/CT/urine collected at today's visit  9) Discussed bASA at 12 weeks through end of pregnancy for preE PP - patient stated understanding  10) Plan to RTC in 3  weeks for ROB with MD, dating [place], and NIPS   Zipporah Plants, CNM, MSN [facility], [facility] 03/19/2021, 4:59 PM

## 2021-03-20 LAB — PROTEIN / CREATININE RATIO, URINE
Creatinine, Urine: 47.7 mg/dL
Protein, Ur: 7.6 mg/dL
Protein/Creat Ratio: 159 mg/g creat (ref 0–200)

## 2021-03-20 LAB — COMPREHENSIVE METABOLIC PANEL
ALT: 15 IU/L (ref 0–32)
AST: 17 IU/L (ref 0–40)
Albumin/Globulin Ratio: 1.9 (ref 1.2–2.2)
Albumin: 4.7 g/dL (ref 3.8–4.8)
Alkaline Phosphatase: 57 IU/L (ref 44–121)
BUN/Creatinine Ratio: 14 (ref 9–23)
BUN: 10 mg/dL (ref 6–24)
Bilirubin Total: <0.2 mg/dL (ref 0.0–1.2)
CO2: 21 mmol/L (ref 20–29)
Calcium: 9.9 mg/dL (ref 8.7–10.2)
Chloride: 103 mmol/L (ref 96–106)
Creatinine, Ser: 0.7 mg/dL (ref 0.57–1.00)
Globulin, Total: 2.5 g/dL (ref 1.5–4.5)
Glucose: 86 mg/dL (ref 65–99)
Potassium: 4.4 mmol/L (ref 3.5–5.2)
Sodium: 141 mmol/L (ref 134–144)
Total Protein: 7.2 g/dL (ref 6.0–8.5)
eGFR: 110 mL/min/{1.73_m2} (ref >59)

## 2021-03-20 LAB — RPR+RH+ABO+RUB AB+AB SCR+CB...
Antibody Screen: NEGATIVE
HIV Screen 4th Generation wRfx: NONREACTIVE
Hematocrit: 41.3 % (ref 34.0–46.6)
Hemoglobin: 13.9 g/dL (ref 11.1–15.9)
Hepatitis B Surface Ag: NEGATIVE
MCH: 31 pg (ref 26.6–33.0)
MCHC: 33.7 g/dL (ref 31.5–35.7)
MCV: 92 fL (ref 79–97)
Platelets: 242 10*3/uL (ref 150–450)
RBC: 4.48 x10E6/uL (ref 3.77–5.28)
RDW: 12.3 % (ref 11.7–15.4)
RPR Ser Ql: NONREACTIVE
Rh Factor: NEGATIVE
Rubella Antibodies, IGG: 1.62 index (ref >0.99)
Varicella zoster IgG: 821 index (ref >165)
WBC: 9.2 10*3/uL (ref 3.4–10.8)

## 2021-03-21 LAB — URINE CULTURE

## 2021-03-21 LAB — CYTOLOGY - PAP
Chlamydia: NEGATIVE
Comment: NEGATIVE
Comment: NEGATIVE
Comment: NORMAL
Diagnosis: NEGATIVE
High risk HPV: NEGATIVE
Neisseria Gonorrhea: NEGATIVE

## 2021-03-28 LAB — INHERITEST CORE(CF97,SMA,FRAX)

## 2021-04-04 ENCOUNTER — Telehealth: Payer: Self-pay

## 2021-04-04 NOTE — Telephone Encounter (Signed)
Patient is scheduled for u/s 04/08/21. Inquiring if it will be an internal u/s. Also inquiring if sunless tanners are safe in pregnancy. Cb#[phone]

## 2021-04-04 NOTE — Telephone Encounter (Signed)
LMVM TRC. U/S will most likely be internal due to her gestational age. Self tanners can be discussed at her visit.

## 2021-04-04 NOTE — Telephone Encounter (Signed)
Spoke w/patient. Advised of TVUS & discuss self tanner at appointment w/provider.

## 2021-04-08 ENCOUNTER — Encounter: Payer: 59 | Admitting: Obstetrics & Gynecology

## 2021-04-12 ENCOUNTER — Other Ambulatory Visit: Payer: Self-pay

## 2021-04-12 ENCOUNTER — Encounter: Payer: Self-pay | Admitting: Obstetrics & Gynecology

## 2021-04-12 ENCOUNTER — Ambulatory Visit (INDEPENDENT_AMBULATORY_CARE_PROVIDER_SITE_OTHER): Payer: 59 | Admitting: Obstetrics & Gynecology

## 2021-04-12 VITALS — BP 120/80 | Wt 151.0 lb

## 2021-04-12 DIAGNOSIS — N926 Irregular menstruation, unspecified: Secondary | ICD-10-CM

## 2021-04-12 DIAGNOSIS — Z1379 Encounter for other screening for genetic and chromosomal anomalies: Secondary | ICD-10-CM

## 2021-04-12 DIAGNOSIS — O09521 Supervision of elderly multigravida, first trimester: Secondary | ICD-10-CM | POA: Diagnosis not present

## 2021-04-12 DIAGNOSIS — O09299 Supervision of pregnancy with other poor reproductive or obstetric history, unspecified trimester: Secondary | ICD-10-CM

## 2021-04-12 DIAGNOSIS — Z3A10 10 weeks gestation of pregnancy: Secondary | ICD-10-CM

## 2021-04-12 DIAGNOSIS — O0991 Supervision of high risk pregnancy, unspecified, first trimester: Secondary | ICD-10-CM

## 2021-04-12 NOTE — Patient Instructions (Signed)
Thank you for choosing [facility]. As part of our ongoing efforts to improve patient experience, we would appreciate your feedback. Please fill out the short survey that you will receive by mail or MyChart. Your opinion is important to us! -Dr Sie Formisano   First Trimester of Pregnancy  The first trimester of pregnancy starts on the first day of your last menstrual period until the end of week 12. This is months 1 through 3 of pregnancy. A week after a sperm fertilizes an egg, the egg will implant into the wall of the uterus and begin to develop into a baby. By the end of 12 weeks, all the baby's organs will be formed and the baby will be 2-3 inches in size. Body changes during your first trimester Your body goes through many changes during pregnancy. The changes vary and generally return to normal after your baby is born. Physical changes  You may gain or lose weight.  Your breasts may begin to grow larger and become tender. The tissue that surrounds your nipples (areola) may become darker.  Dark spots or blotches (chloasma or mask of pregnancy) may develop on your face.  You may have changes in your hair. These can include thickening or thinning of your hair or changes in texture. Health changes  You may feel nauseous, and you may vomit.  You may have heartburn.  You may develop headaches.  You may develop constipation.  Your gums may bleed and may be sensitive to brushing and flossing. Other changes  You may tire easily.  You may urinate more often.  Your menstrual periods will stop.  You may have a loss of appetite.  You may develop cravings for certain kinds of food.  You may have changes in your emotions from day to day.  You may have more vivid and strange dreams. Follow these instructions at home: Medicines  Follow your health care provider's instructions regarding medicine use. Specific medicines may be either safe or unsafe to take during pregnancy. Do not take  any medicines unless told to by your health care provider.  Take a prenatal vitamin that contains at least 600 micrograms (mcg) of folic acid. Eating and drinking  Eat a healthy diet that includes fresh fruits and vegetables, whole grains, good sources of protein such as meat, eggs, or tofu, and low-fat dairy products.  Avoid raw meat and unpasteurized juice, milk, and cheese. These carry germs that can harm you and your baby.  If you feel nauseous or you vomit: ? Eat 4 or 5 small meals a day instead of 3 large meals. ? Try eating a few soda crackers. ? Drink liquids between meals instead of during meals.  You may need to take these actions to prevent or treat constipation: ? Drink enough fluid to keep your urine pale yellow. ? Eat foods that are high in fiber, such as beans, whole grains, and fresh fruits and vegetables. ? Limit foods that are high in fat and processed sugars, such as fried or sweet foods. Activity  Exercise only as directed by your health care provider. Most people can continue their usual exercise routine during pregnancy. Try to exercise for 30 minutes at least 5 days a week.  Stop exercising if you develop pain or cramping in the lower abdomen or lower back.  Avoid exercising if it is very hot or humid or if you are at high altitude.  Avoid heavy lifting.  If you choose to, you may have sex unless your   health care provider tells you not to. Relieving pain and discomfort  Wear a good support bra to relieve breast tenderness.  Rest with your legs elevated if you have leg cramps or low back pain.  If you develop bulging veins (varicose veins) in your legs: ? Wear support hose as told by your health care provider. ? Elevate your feet for 15 minutes, 3-4 times a day. ? Limit salt in your diet. Safety  Wear your seat belt at all times when driving or riding in a car.  Talk with your health care provider if someone is verbally or physically abusive to  you.  Talk with your health care provider if you are feeling sad or have thoughts of hurting yourself. Lifestyle  Do not use hot tubs, steam rooms, or saunas.  Do not douche. Do not use tampons or scented sanitary pads.  Do not use herbal remedies, alcohol, illegal drugs, or medicines that are not approved by your health care provider. Chemicals in these products can harm your baby.  Do not use any products that contain nicotine or tobacco, such as cigarettes, e-cigarettes, and chewing tobacco. If you need help quitting, ask your health care provider.  Avoid cat litter boxes and soil used by cats. These carry germs that can cause birth defects in the baby and possibly loss of the unborn baby (fetus) by miscarriage or stillbirth. General instructions  During routine prenatal visits in the first trimester, your health care provider will do a physical exam, perform necessary tests, and ask you how things are going. Keep all follow-up visits. This is important.  Ask for help if you have counseling or nutritional needs during pregnancy. Your health care provider can offer advice or refer you to specialists for help with various needs.  Schedule a dentist appointment. At home, brush your teeth with a soft toothbrush. Floss gently.  Write down your questions. Take them to your prenatal visits. Where to find more information  American Pregnancy Association: americanpregnancy.org  American College of Obstetricians and Gynecologists: acog.org/en/Womens%20Health/Pregnancy  Office on Women's Health: womenshealth.gov/pregnancy Contact a health care provider if you have:  Dizziness.  A fever.  Mild pelvic cramps, pelvic pressure, or nagging pain in the abdominal area.  Nausea, vomiting, or diarrhea that lasts for 24 hours or longer.  A bad-smelling vaginal discharge.  Pain when you urinate.  Known exposure to a contagious illness, such as chickenpox, measles, Zika virus, HIV, or  hepatitis. Get help right away if you have:  Spotting or bleeding from your vagina.  Severe abdominal cramping or pain.  Shortness of breath or chest pain.  Any kind of trauma, such as from a fall or a car crash.  New or increased pain, swelling, or redness in an arm or leg. Summary  The first trimester of pregnancy starts on the first day of your last menstrual period until the end of week 12 (months 1 through 3).  Eating 4 or 5 small meals a day rather than 3 large meals may help to relieve nausea and vomiting.  Do not use any products that contain nicotine or tobacco, such as cigarettes, e-cigarettes, and chewing tobacco. If you need help quitting, ask your health care provider.  Keep all follow-up visits. This is important. This information is not intended to replace advice given to you by your health care provider. Make sure you discuss any questions you have with your health care provider. Document Revised: 04/11/2020 Document Reviewed: 02/16/2020 Elsevier Patient Education  2021 Elsevier   Inc.  

## 2021-04-12 NOTE — Progress Notes (Signed)
  Subjective  Nausea? mild Vaginal Bleeding? no  Objective  BP 120/80   Wt 151 lb (68.5 kg)   LMP 01/28/2021   BMI 25.13 kg/m  General: NAD Pumonary: no increased work of breathing Abdomen: gravid, non-tender Extremities: no edema Psychiatric: mood appropriate, affect full See [place] report  Assessment  45 y.o. [id number] at [redacted]w[redacted]d by  11/04/2021, by Last Menstrual Period presenting for routine prenatal visit  Plan   Problem List Items Addressed This Visit      Other   High-risk pregnancy, first trimester - Primary   History of pre-eclampsia in prior pregnancy, currently pregnant   Multigravida of advanced maternal age in first trimester   Relevant Orders   MaterniT21 PLUS Core+SCA    Other Visit Diagnoses    Encounter for genetic screening for Down Syndrome       Relevant Orders   MaterniT21 PLUS Core+SCA   [redacted] weeks gestation of pregnancy           Problem Noted Resolved   High-risk pregnancy, first trimester     Overview Addendum 03/29/2021  8:37 AM by Zipporah Plants, CNM     Nursing Staff Provider  Office Location  [facility] Dating   LMP  Language  English Anatomy US    Flu Vaccine   declined Genetic Screen  NIPS:  Desires  TDaP vaccine    Hgb A1C or  GTT Early : n/a Third trimester :   Rhogam      LAB RESULTS   Feeding Plan  breast Blood Type   B negative  Contraception  Antibody   Negative  Circumcision  Rubella   Immune  Pediatrician   RPR   Non-reactive  Support Person  Weston Brass - husband HBsAg   Negative  Prenatal Classes  HIV   Non-reactive    Varicella   Immune  BTL Consent  n/a GBS  (For PCN allergy, check sensitivities)        VBAC Consent  n/a Pap  03/19/21 - NILM/neg    Hgb Electro   n/a    CF  negative     SMA  negative            AMA discussed       NIPT today       Risks for miscarriage, labor dysfunction  PNV  Annamarie Major, MD, [facility], [facility] 04/12/2021  3:20 PM

## 2021-04-12 NOTE — Progress Notes (Signed)
ULTRASOUND REPORT  Location: [facility] Date of Service: 04/12/2021   Indications:dating Findings:  Mason Jim intrauterine pregnancy is visualized with a CRL consistent with [redacted]w[redacted]d gestation, giving an (U/S) EDD of 11/04/2021. The (U/S) EDD is consistent with the clinically established EDD of 11/04/21.  FHR: 150 BPM CRL measurement: 36.1 mm Yolk sac is not visualized. Amnion: visualized and appears normal   Right Ovary is normal in appearance. Left Ovary is normal appearance. Corpus luteal cyst:  is not visualized Survey of the adnexa demonstrates no adnexal masses. There is no free peritoneal fluid in the cul de sac.  Impression: 1. [redacted]w[redacted]d Viable Singleton Intrauterine pregnancy by U/S. 2. (U/S) EDD is consistent with Clinically established EDD of 11/04/2021.  Recommendations: 1.Clinical correlation with the patient's History and Physical Exam. 2. Maintain EDC  Letitia Libra, MD

## 2021-04-16 ENCOUNTER — Other Ambulatory Visit: Payer: Self-pay | Admitting: Obstetrics & Gynecology

## 2021-04-16 MED ORDER — ACYCLOVIR 200 MG PO CAPS: 200.0000 mg | ORAL_CAPSULE | Freq: Every day | ORAL | 0 refills | Status: DC

## 2021-04-19 LAB — MATERNIT21 PLUS CORE+SCA
Fetal Fraction: 7
Monosomy X (Turner Syndrome): NOT DETECTED
Result (T21): NEGATIVE
Trisomy 13 (Patau syndrome): NEGATIVE
Trisomy 18 (Edwards syndrome): NEGATIVE
Trisomy 21 (Down syndrome): NEGATIVE
XXX (Triple X Syndrome): NOT DETECTED
XXY (Klinefelter Syndrome): NOT DETECTED
XYY (Jacobs Syndrome): NOT DETECTED

## 2021-05-10 ENCOUNTER — Other Ambulatory Visit: Payer: Self-pay

## 2021-05-10 ENCOUNTER — Encounter: Payer: Self-pay | Admitting: Obstetrics and Gynecology

## 2021-05-10 ENCOUNTER — Ambulatory Visit (INDEPENDENT_AMBULATORY_CARE_PROVIDER_SITE_OTHER): Payer: 59 | Admitting: Obstetrics and Gynecology

## 2021-05-10 ENCOUNTER — Other Ambulatory Visit: Payer: Self-pay | Admitting: Advanced Practice Midwife

## 2021-05-10 VITALS — BP 112/68 | Ht 65.0 in | Wt 156.2 lb

## 2021-05-10 DIAGNOSIS — B001 Herpesviral vesicular dermatitis: Secondary | ICD-10-CM

## 2021-05-10 DIAGNOSIS — B009 Herpesviral infection, unspecified: Secondary | ICD-10-CM

## 2021-05-10 DIAGNOSIS — O09521 Supervision of elderly multigravida, first trimester: Secondary | ICD-10-CM

## 2021-05-10 DIAGNOSIS — Z3A14 14 weeks gestation of pregnancy: Secondary | ICD-10-CM

## 2021-05-10 DIAGNOSIS — O0991 Supervision of high risk pregnancy, unspecified, first trimester: Secondary | ICD-10-CM

## 2021-05-10 DIAGNOSIS — O09299 Supervision of pregnancy with other poor reproductive or obstetric history, unspecified trimester: Secondary | ICD-10-CM

## 2021-05-10 MED ORDER — VALACYCLOVIR HCL 1 G PO TABS: 1000.0000 mg | ORAL_TABLET | Freq: Every day | ORAL | 3 refills | Status: DC

## 2021-05-10 MED ORDER — ACYCLOVIR 200 MG PO CAPS: 200.0000 mg | ORAL_CAPSULE | Freq: Every day | ORAL | 0 refills | Status: DC

## 2021-05-10 NOTE — Progress Notes (Signed)
    Routine Prenatal Care Visit  Subjective  Lauren Mann is a 45 y.o. [id number] at [redacted]w[redacted]d being seen today for ongoing prenatal care.  She is currently monitored for the following issues for this high-risk pregnancy and has High-risk pregnancy, first trimester; History of pre-eclampsia in prior pregnancy, currently pregnant; and Multigravida of advanced maternal age in first trimester on their problem list.  ----------------------------------------------------------------------------------- Patient reports no complaints.    . Vag. Bleeding: None.   . Denies leaking of fluid.  ----------------------------------------------------------------------------------- The following portions of the patient's history were reviewed and updated as appropriate: allergies, current medications, past family history, past medical history, past social history, past surgical history and problem list. Problem list updated.   Objective  Blood pressure 112/68, height 5\' 5"  (1.651 m), weight 156 lb 3.2 oz (70.9 kg), last menstrual period 01/28/2021. Pregravid weight 140 lb (63.5 kg) Total Weight Gain 16 lb 3.2 oz (7.348 kg) Urinalysis:      Fetal Status:           General:  Alert, oriented and cooperative. Patient is in no acute distress.  Skin: Skin is warm and dry. No rash noted.   Cardiovascular: Normal heart rate noted  Respiratory: Normal respiratory effort, no problems with respiration noted  Abdomen: Soft, gravid, appropriate for gestational age. Pain/Pressure: Absent     Pelvic:  Cervical exam deferred        Extremities: Normal range of motion.     Mental Status: Normal mood and affect. Normal behavior. Normal judgment and thought content.     Assessment   45 y.o. 55 at [redacted]w[redacted]d by  11/04/2021, by Last Menstrual Period presenting for routine prenatal visit  Plan   Pregnancy #4 Problems (from 03/19/21 to present)     Problem Noted Resolved   High-risk pregnancy, first trimester 03/19/2021 by  05/19/2021, CNM No   Overview Addendum 05/10/2021  4:09 PM by 05/12/2021, MD     Nursing Staff Provider  Office Location  [facility] Dating   LMP, confirmed Natale Milch 10 wks  Language  English Anatomy US    Flu Vaccine   declined Genetic Screen  NIPS:  Normal XY  TDaP vaccine    Hgb A1C or  GTT Early : n/a Third trimester :   Rhogam      LAB RESULTS   Feeding Plan  breast Blood Type   B negative  Contraception  Antibody   Negative  Circumcision  Rubella   Immune  Pediatrician   RPR   Non-reactive  Support Person  [place] - husband HBsAg   Negative  Prenatal Classes  HIV   Non-reactive    Varicella   Immune  BTL Consent  n/a GBS  (For PCN allergy, check sensitivities)        VBAC Consent  n/a Pap  03/19/21 - NILM/neg    Hgb Electro   n/a    CF  negative     SMA  negative                   Discussed daily 81 mg asa initiation   Gestational age appropriate obstetric precautions including but not limited to vaginal bleeding, contractions, leaking of fluid and fetal movement were reviewed in detail with the patient.    Return in about 4 weeks (around 06/07/2021) for ROB in person.  06/09/2021 MD [facility], [facility] 05/10/2021, 4:18 PM

## 2021-05-10 NOTE — Patient Instructions (Signed)

## 2021-05-10 NOTE — Progress Notes (Signed)
Rx acyclovir reorder per patient request

## 2021-06-10 ENCOUNTER — Other Ambulatory Visit: Payer: Self-pay

## 2021-06-10 ENCOUNTER — Ambulatory Visit (INDEPENDENT_AMBULATORY_CARE_PROVIDER_SITE_OTHER): Payer: 59 | Admitting: Obstetrics and Gynecology

## 2021-06-10 VITALS — BP 118/72 | Wt 166.0 lb

## 2021-06-10 DIAGNOSIS — O09299 Supervision of pregnancy with other poor reproductive or obstetric history, unspecified trimester: Secondary | ICD-10-CM

## 2021-06-10 DIAGNOSIS — O09521 Supervision of elderly multigravida, first trimester: Secondary | ICD-10-CM

## 2021-06-10 DIAGNOSIS — O0991 Supervision of high risk pregnancy, unspecified, first trimester: Secondary | ICD-10-CM

## 2021-06-10 LAB — POCT URINALYSIS DIPSTICK OB
Glucose, UA: NEGATIVE
POC,PROTEIN,UA: NEGATIVE

## 2021-06-10 NOTE — Progress Notes (Signed)
ROB - no concerns. RM 4 °

## 2021-06-10 NOTE — Progress Notes (Signed)
    Routine Prenatal Care Visit  Subjective  KORTNI HASTEN is a 45 y.o. [id number] at [redacted]w[redacted]d being seen today for ongoing prenatal care.  She is currently monitored for the following issues for this high-risk pregnancy and has High-risk pregnancy, first trimester; History of pre-eclampsia in prior pregnancy, currently pregnant; and Multigravida of advanced maternal age in first trimester on their problem list.  ----------------------------------------------------------------------------------- Patient reports no complaints.   Contractions: Not present. Vag. Bleeding: None.  Movement: Present. Denies leaking of fluid.  ----------------------------------------------------------------------------------- The following portions of the patient's history were reviewed and updated as appropriate: allergies, current medications, past family history, past medical history, past social history, past surgical history and problem list. Problem list updated.   Objective  Last menstrual period 01/28/2021. Pregravid weight 140 lb (63.5 kg) Total Weight Gain 26 lb (11.8 kg) Urinalysis:      Fetal Status: Fetal Heart Rate (bpm): 145   Movement: Present     General:  Alert, oriented and cooperative. Patient is in no acute distress.  Skin: Skin is warm and dry. No rash noted.   Cardiovascular: Normal heart rate noted  Respiratory: Normal respiratory effort, no problems with respiration noted  Abdomen: Soft, gravid, appropriate for gestational age. Pain/Pressure: Absent     Pelvic:  Cervical exam deferred        Extremities: Normal range of motion.     ental Status: Normal mood and affect. Normal behavior. Normal judgment and thought content.     Assessment   45 y.o. [id number] at [redacted]w[redacted]d by  11/04/2021, by Last Menstrual Period presenting for routine prenatal visit  Plan   Pregnancy #4 Problems (from 03/19/21 to present)     Problem Noted Resolved   High-risk pregnancy, first trimester 03/19/2021 by Zipporah Plants, CNM No   Overview Addendum 05/10/2021  4:09 PM by Natale Milch, MD     Nursing Staff Provider  Office Location  [facility]   LMP, confirmed [place] 10 wks  Language  English Anatomy US    Flu Vaccine   declined Genetic Screen  NIPS:  Normal XY  TDaP vaccine    Hgb A1C or  GTT Early : n/a Third trimester :   Rhogam      LAB RESULTS   Feeding Plan  breast Blood Type   B negative  Contraception  Antibody   Negative  Circumcision  Rubella   Immune  Pediatrician   RPR   Non-reactive  Support Person  Weston Brass - husband HBsAg   Negative  Prenatal Classes  HIV   Non-reactive    Varicella   Immune  BTL Consent  n/a GBS  (For PCN allergy, check sensitivities)        VBAC Consent  n/a Pap  03/19/21 - NILM/neg    Hgb Electro   n/a    CF  negative     SMA  negative                   Gestational age appropriate obstetric precautions including but not limited to vaginal bleeding, contractions, leaking of fluid and fetal movement were reviewed in detail with the patient.    - anatomy scan scheduled for next week  Return KEEP ROB NEXT WEEK.  Vena Austria, MD, Ever[facility], [facility] 06/10/2021, 4:18 PM

## 2021-06-13 ENCOUNTER — Ambulatory Visit: Payer: 59

## 2021-06-18 ENCOUNTER — Other Ambulatory Visit: Payer: Self-pay | Admitting: *Deleted

## 2021-06-18 ENCOUNTER — Ambulatory Visit (HOSPITAL_BASED_OUTPATIENT_CLINIC_OR_DEPARTMENT_OTHER): Payer: 59

## 2021-06-18 ENCOUNTER — Ambulatory Visit: Payer: 59 | Attending: Obstetrics and Gynecology | Admitting: *Deleted

## 2021-06-18 ENCOUNTER — Other Ambulatory Visit: Payer: Self-pay

## 2021-06-18 VITALS — BP 132/71 | HR 85

## 2021-06-18 DIAGNOSIS — O09522 Supervision of elderly multigravida, second trimester: Secondary | ICD-10-CM | POA: Diagnosis not present

## 2021-06-18 DIAGNOSIS — Z8751 Personal history of pre-term labor: Secondary | ICD-10-CM | POA: Diagnosis not present

## 2021-06-18 DIAGNOSIS — Z3A20 20 weeks gestation of pregnancy: Secondary | ICD-10-CM | POA: Insufficient documentation

## 2021-06-18 DIAGNOSIS — Z363 Encounter for antenatal screening for malformations: Secondary | ICD-10-CM | POA: Insufficient documentation

## 2021-06-18 DIAGNOSIS — O0991 Supervision of high risk pregnancy, unspecified, first trimester: Secondary | ICD-10-CM

## 2021-06-20 ENCOUNTER — Other Ambulatory Visit: Payer: Self-pay

## 2021-06-20 ENCOUNTER — Ambulatory Visit (INDEPENDENT_AMBULATORY_CARE_PROVIDER_SITE_OTHER): Payer: 59 | Admitting: Obstetrics and Gynecology

## 2021-06-20 ENCOUNTER — Encounter: Payer: Self-pay | Admitting: Obstetrics and Gynecology

## 2021-06-20 VITALS — BP 117/70 | Ht 65.0 in | Wt 168.5 lb

## 2021-06-20 DIAGNOSIS — O0992 Supervision of high risk pregnancy, unspecified, second trimester: Secondary | ICD-10-CM

## 2021-06-20 DIAGNOSIS — Z3A20 20 weeks gestation of pregnancy: Secondary | ICD-10-CM

## 2021-06-20 LAB — POCT URINALYSIS DIPSTICK OB
Glucose, UA: NEGATIVE
POC,PROTEIN,UA: NEGATIVE

## 2021-06-20 NOTE — Progress Notes (Signed)
    Routine Prenatal Care Visit  Subjective  Lauren Mann is a 45 y.o. [id number] at [redacted]w[redacted]d being seen today for ongoing prenatal care.  She is currently monitored for the following issues for this high-risk pregnancy and has High-risk pregnancy, second trimester; History of pre-eclampsia in prior pregnancy, currently pregnant; and Multigravida of advanced maternal age in first trimester on their problem list.  ----------------------------------------------------------------------------------- Patient reports no complaints.   Contractions: Not present. Vag. Bleeding: None.  Movement: Present. Denies leaking of fluid.  ----------------------------------------------------------------------------------- The following portions of the patient's history were reviewed and updated as appropriate: allergies, current medications, past family history, past medical history, past social history, past surgical history and problem list. Problem list updated.   Objective  Blood pressure 117/70, height 5\' 5"  (1.651 m), weight 168 lb 8 oz (76.4 kg), last menstrual period 01/28/2021. Pregravid weight 140 lb (63.5 kg) Total Weight Gain 28 lb 8 oz (12.9 kg) Urinalysis:      Fetal Status: Fetal Heart Rate (bpm): 150   Movement: Present     General:  Alert, oriented and cooperative. Patient is in no acute distress.  Skin: Skin is warm and dry. No rash noted.   Cardiovascular: Normal heart rate noted  Respiratory: Normal respiratory effort, no problems with respiration noted  Abdomen: Soft, gravid, appropriate for gestational age. Pain/Pressure: Absent     Pelvic:  Cervical exam deferred        Extremities: Normal range of motion.  Edema: None  Mental Status: Normal mood and affect. Normal behavior. Normal judgment and thought content.     Assessment   45 y.o. 55 at [redacted]w[redacted]d by  11/04/2021, by Last Menstrual Period presenting for routine prenatal visit  Plan   Pregnancy #4 Problems (from 03/19/21 to  present)     Problem Noted Resolved   High-risk pregnancy, second trimester 03/19/2021 by 05/19/2021, CNM No   Overview Addendum 06/20/2021  4:12 PM by 08/20/2021, MD     Nursing Staff Provider  Office Location  [facility]   LMP, confirmed Natale Milch 10 wks  Language  English Anatomy US  Complete- echogenic focus  Flu Vaccine   declined Genetic Screen  NIPS:  Normal XY  TDaP vaccine    Hgb A1C or  GTT Early : n/a Third trimester :   Rhogam      LAB RESULTS   Feeding Plan  breast Blood Type   B negative  Contraception  Antibody   Negative  Circumcision  Rubella   Immune  Pediatrician   RPR   Non-reactive  Support Person  [place] - husband HBsAg   Negative  Prenatal Classes  HIV   Non-reactive    Varicella   Immune  BTL Consent  n/a GBS  (For PCN allergy, check sensitivities)        VBAC Consent  n/a Pap  03/19/21 - NILM/neg    Hgb Electro   n/a    CF  negative     SMA  negative                   Gestational age appropriate obstetric precautions including but not limited to vaginal bleeding, contractions, leaking of fluid and fetal movement were reviewed in detail with the patient.    Return in about 4 weeks (around 07/18/2021) for ROB in person.  09/17/2021 MD [facility], [facility] 06/20/2021, 4:12 PM

## 2021-07-16 ENCOUNTER — Ambulatory Visit: Payer: 59 | Attending: Obstetrics

## 2021-07-16 ENCOUNTER — Other Ambulatory Visit: Payer: Self-pay

## 2021-07-16 ENCOUNTER — Ambulatory Visit: Payer: 59 | Admitting: *Deleted

## 2021-07-16 VITALS — BP 125/71 | HR 89

## 2021-07-16 DIAGNOSIS — O09212 Supervision of pregnancy with history of pre-term labor, second trimester: Secondary | ICD-10-CM

## 2021-07-16 DIAGNOSIS — O09292 Supervision of pregnancy with other poor reproductive or obstetric history, second trimester: Secondary | ICD-10-CM

## 2021-07-16 DIAGNOSIS — O0992 Supervision of high risk pregnancy, unspecified, second trimester: Secondary | ICD-10-CM | POA: Insufficient documentation

## 2021-07-16 DIAGNOSIS — O09522 Supervision of elderly multigravida, second trimester: Secondary | ICD-10-CM | POA: Diagnosis not present

## 2021-07-16 DIAGNOSIS — Z3A24 24 weeks gestation of pregnancy: Secondary | ICD-10-CM | POA: Diagnosis not present

## 2021-07-18 ENCOUNTER — Ambulatory Visit (INDEPENDENT_AMBULATORY_CARE_PROVIDER_SITE_OTHER): Payer: 59 | Admitting: Obstetrics

## 2021-07-18 ENCOUNTER — Other Ambulatory Visit: Payer: Self-pay

## 2021-07-18 VITALS — BP 120/80 | Wt 175.0 lb

## 2021-07-18 DIAGNOSIS — Z3A24 24 weeks gestation of pregnancy: Secondary | ICD-10-CM

## 2021-07-18 DIAGNOSIS — O0992 Supervision of high risk pregnancy, unspecified, second trimester: Secondary | ICD-10-CM

## 2021-07-18 NOTE — Progress Notes (Signed)
  Routine Prenatal Care Visit  Subjective  Lauren Mann is a 45 y.o. [id number] at [redacted]w[redacted]d being seen today for ongoing prenatal care.  She is currently monitored for the following issues for this low-risk pregnancy and has High-risk pregnancy, second trimester; History of pre-eclampsia in prior pregnancy, currently pregnant; and Multigravida of advanced maternal age in first trimester on their problem list.  ----------------------------------------------------------------------------------- Patient reports no complaints.   Contractions: Not present. Vag. Bleeding: None.  Movement: Present. Leaking Fluid denies.  ----------------------------------------------------------------------------------- The following portions of the patient's history were reviewed and updated as appropriate: allergies, current medications, past family history, past medical history, past social history, past surgical history and problem list. Problem list updated.  Objective  Blood pressure 120/80, weight 175 lb (79.4 kg), last menstrual period 01/28/2021. Pregravid weight 140 lb (63.5 kg) Total Weight Gain 35 lb (15.9 kg) Urinalysis: Urine Protein    Urine Glucose    Fetal Status:     Movement: Present     General:  Alert, oriented and cooperative. Patient is in no acute distress.  Skin: Skin is warm and dry. No rash noted.   Cardiovascular: Normal heart rate noted  Respiratory: Normal respiratory effort, no problems with respiration noted  Abdomen: Soft, gravid, appropriate for gestational age. Pain/Pressure: Present     Pelvic:  Cervical exam deferred        Extremities: Normal range of motion.     Mental Status: Normal mood and affect. Normal behavior. Normal judgment and thought content.   Assessment   45 y.o. [id number] at [redacted]w[redacted]d by  11/04/2021, by Last Menstrual Period presenting for routine prenatal visit  Plan   May 2022 Problems (from 03/19/21 to present)    Problem Noted Resolved   High-risk pregnancy,  second trimester 03/19/2021 by Zipporah Plants, CNM No   Overview Addendum 06/20/2021  4:12 PM by Natale Milch, MD     Nursing Staff Provider  Office Location  [facility]   LMP, confirmed [place] 10 wks  Language  English Anatomy US  Complete- echogenic focus  Flu Vaccine   declined Genetic Screen  NIPS:  Normal XY  TDaP vaccine    Hgb A1C or  GTT Early : n/a Third trimester :   Rhogam      LAB RESULTS   Feeding Plan  breast Blood Type   B negative  Contraception  Antibody   Negative  Circumcision  Rubella   Immune  Pediatrician   RPR   Non-reactive  Support Person  Weston Brass - husband HBsAg   Negative  Prenatal Classes  HIV   Non-reactive    Varicella   Immune  BTL Consent  n/a GBS  (For PCN allergy, check sensitivities)        VBAC Consent  n/a Pap  03/19/21 - NILM/neg    Hgb Electro   n/a    CF  negative     SMA  negative                   Preterm labor symptoms and general obstetric precautions including but not limited to vaginal bleeding, contractions, leaking of fluid and fetal movement were reviewed in detail with the patient. Please refer to After Visit Summary for other counseling recommendations.   No follow-ups on file.  Mirna Mires, CNM  07/18/2021 5:37 PM

## 2021-07-23 ENCOUNTER — Telehealth: Payer: Self-pay

## 2021-07-23 NOTE — Telephone Encounter (Signed)
Pt got stung by a yellow jacket 4 times two weeks ago, still itching bad. Pt has used OTC medicines and not a lot of relief, should she be worried since she is pregnant?

## 2021-07-25 NOTE — Telephone Encounter (Signed)
Left message to advise pt of Dr Jean Rosenthal advise

## 2021-08-16 ENCOUNTER — Other Ambulatory Visit: Payer: 59

## 2021-08-16 ENCOUNTER — Other Ambulatory Visit: Payer: Self-pay

## 2021-08-16 ENCOUNTER — Encounter: Payer: Self-pay | Admitting: Advanced Practice Midwife

## 2021-08-16 ENCOUNTER — Ambulatory Visit (INDEPENDENT_AMBULATORY_CARE_PROVIDER_SITE_OTHER): Payer: 59 | Admitting: Advanced Practice Midwife

## 2021-08-16 VITALS — BP 118/76 | Wt 185.0 lb

## 2021-08-16 DIAGNOSIS — Z6791 Unspecified blood type, Rh negative: Secondary | ICD-10-CM

## 2021-08-16 DIAGNOSIS — O0992 Supervision of high risk pregnancy, unspecified, second trimester: Secondary | ICD-10-CM

## 2021-08-16 DIAGNOSIS — O09521 Supervision of elderly multigravida, first trimester: Secondary | ICD-10-CM

## 2021-08-16 DIAGNOSIS — O09299 Supervision of pregnancy with other poor reproductive or obstetric history, unspecified trimester: Secondary | ICD-10-CM

## 2021-08-16 DIAGNOSIS — Z3A28 28 weeks gestation of pregnancy: Secondary | ICD-10-CM

## 2021-08-16 DIAGNOSIS — O26893 Other specified pregnancy related conditions, third trimester: Secondary | ICD-10-CM

## 2021-08-16 DIAGNOSIS — O0993 Supervision of high risk pregnancy, unspecified, third trimester: Secondary | ICD-10-CM

## 2021-08-16 LAB — POCT URINALYSIS DIPSTICK OB
Glucose, UA: NEGATIVE
POC,PROTEIN,UA: NEGATIVE

## 2021-08-16 MED ORDER — RHO D IMMUNE GLOBULIN 1500 UNIT/2ML IJ SOSY
300.0000 ug | PREFILLED_SYRINGE | Freq: Once | INTRAMUSCULAR | Status: AC
  Administered 2021-08-16: 300 ug via INTRAMUSCULAR

## 2021-08-16 NOTE — Progress Notes (Signed)
ROB - 1hr gtt, no concerns. RM 4 

## 2021-08-16 NOTE — Progress Notes (Signed)
  Routine Prenatal Care Visit  Subjective  Lauren Mann is a 45 y.o. [id number] at [redacted]w[redacted]d being seen today for ongoing prenatal care.  She is currently monitored for the following issues for this high-risk pregnancy and has Supervision of high risk pregnancy, antepartum; History of pre-eclampsia in prior pregnancy, currently pregnant; and Multigravida of advanced maternal age in first trimester on their problem list.  ----------------------------------------------------------------------------------- Patient reports pelvic pain and pressure.  Reviewed comfort measures for pubic symphysis pain. Contractions: Not present. Vag. Bleeding: None.  Movement: Present. Leaking Fluid denies.  ----------------------------------------------------------------------------------- The following portions of the patient's history were reviewed and updated as appropriate: allergies, current medications, past family history, past medical history, past social history, past surgical history and problem list. Problem list updated.  Objective  Blood pressure 118/76, weight 185 lb (83.9 kg), last menstrual period 01/28/2021. Pregravid weight 140 lb (63.5 kg) Total Weight Gain 45 lb (20.4 kg) Urinalysis: Urine Protein Negative  Urine Glucose Negative  Fetal Status: Fetal Heart Rate (bpm): 144 Fundal Height: 29 cm Movement: Present     General:  Alert, oriented and cooperative. Patient is in no acute distress.  Skin: Skin is warm and dry. No rash noted.   Cardiovascular: Normal heart rate noted  Respiratory: Normal respiratory effort, no problems with respiration noted  Abdomen: Soft, gravid, appropriate for gestational age. Pain/Pressure: Present     Pelvic:  Cervical exam deferred        Extremities: Normal range of motion.  Edema: None  Mental Status: Normal mood and affect. Normal behavior. Normal judgment and thought content.   Assessment   45 y.o. [id number] at [redacted]w[redacted]d by  11/04/2021, by Last Menstrual Period  presenting for routine prenatal visit  Plan   May 2022 Problems (from 03/19/21 to present)    Problem Noted Resolved   Supervision of high risk pregnancy, antepartum 03/19/2021 by Zipporah Plants, CNM No   Overview Addendum 06/20/2021  4:12 PM by Natale Milch, MD     Nursing Staff Provider  Office Location  [facility]   LMP, confirmed [place] 10 wks  Language  English Anatomy US  Complete- echogenic focus  Flu Vaccine   declined Genetic Screen  NIPS:  Normal XY  TDaP vaccine    Hgb A1C or  GTT Early : n/a Third trimester :   Rhogam   08/16/21   LAB RESULTS   Feeding Plan  breast Blood Type   B negative  Contraception  Antibody   Negative  Circumcision  Rubella   Immune  Pediatrician   RPR   Non-reactive  Support Person  Weston Brass - husband HBsAg   Negative  Prenatal Classes  HIV   Non-reactive    Varicella   Immune  BTL Consent  n/a GBS  (For PCN allergy, check sensitivities)        VBAC Consent  n/a Pap  03/19/21 - NILM/neg    Hgb Electro   n/a    CF  negative     SMA  negative                   Preterm labor symptoms and general obstetric precautions including but not limited to vaginal bleeding, contractions, leaking of fluid and fetal movement were reviewed in detail with the patient.   Return in about 2 weeks (around 08/30/2021) for rob.  [name], CNM 08/16/2021 10:22 AM

## 2021-08-16 NOTE — Addendum Note (Signed)
Addended by: Fortunato Curling R on: 08/16/2021 11:12 AM   Modules accepted: Orders

## 2021-08-17 DIAGNOSIS — Z419 Encounter for procedure for purposes other than remedying health state, unspecified: Secondary | ICD-10-CM | POA: Diagnosis not present

## 2021-08-17 LAB — 28 WEEK RH+PANEL
Basophils Absolute: 0 10*3/uL (ref 0.0–0.2)
Basos: 0 %
EOS (ABSOLUTE): 0.1 10*3/uL (ref 0.0–0.4)
Eos: 1 %
Gestational Diabetes Screen: 114 mg/dL (ref 65–139)
HIV Screen 4th Generation wRfx: NONREACTIVE
Hematocrit: 31.2 % — ABNORMAL LOW (ref 34.0–46.6)
Hemoglobin: 10.1 g/dL — ABNORMAL LOW (ref 11.1–15.9)
Immature Grans (Abs): 0.1 10*3/uL (ref 0.0–0.1)
Immature Granulocytes: 1 %
Lymphocytes Absolute: 1.4 10*3/uL (ref 0.7–3.1)
Lymphs: 15 %
MCH: 30.9 pg (ref 26.6–33.0)
MCHC: 32.4 g/dL (ref 31.5–35.7)
MCV: 95 fL (ref 79–97)
Monocytes Absolute: 0.5 10*3/uL (ref 0.1–0.9)
Monocytes: 5 %
Neutrophils Absolute: 7.2 10*3/uL — ABNORMAL HIGH (ref 1.4–7.0)
Neutrophils: 78 %
Platelets: 219 10*3/uL (ref 150–450)
RBC: 3.27 x10E6/uL — ABNORMAL LOW (ref 3.77–5.28)
RDW: 12.8 % (ref 11.7–15.4)
RPR Ser Ql: NONREACTIVE
WBC: 9.4 10*3/uL (ref 3.4–10.8)

## 2021-08-30 ENCOUNTER — Ambulatory Visit (INDEPENDENT_AMBULATORY_CARE_PROVIDER_SITE_OTHER): Payer: 59 | Admitting: Obstetrics

## 2021-08-30 ENCOUNTER — Other Ambulatory Visit: Payer: Self-pay

## 2021-08-30 VITALS — BP 122/74 | Wt 186.0 lb

## 2021-08-30 DIAGNOSIS — Z3480 Encounter for supervision of other normal pregnancy, unspecified trimester: Secondary | ICD-10-CM

## 2021-08-30 DIAGNOSIS — Z3A30 30 weeks gestation of pregnancy: Secondary | ICD-10-CM

## 2021-08-30 NOTE — Progress Notes (Signed)
Some braxton hicks. No vb. No lof. Pt wants to get TDAP at [place].

## 2021-08-30 NOTE — Progress Notes (Signed)
  Routine Prenatal Care Visit  Subjective  Lauren Mann is a 45 y.o. [id number] at [redacted]w[redacted]d being seen today for ongoing prenatal care.  She is currently monitored for the following issues for this low-risk pregnancy and has Supervision of high risk pregnancy, antepartum; History of pre-eclampsia in prior pregnancy, currently pregnant; and Multigravida of advanced maternal age in first trimester on their problem list.  ----------------------------------------------------------------------------------- Patient reports no complaints.   Contractions: Irregular. Vag. Bleeding: None.  Movement: Present. Leaking Fluid denies.  ----------------------------------------------------------------------------------- The following portions of the patient's history were reviewed and updated as appropriate: allergies, current medications, past family history, past medical history, past social history, past surgical history and problem list. Problem list updated.  Objective  Blood pressure 122/74, weight 186 lb (84.4 kg), last menstrual period 01/28/2021. Pregravid weight 140 lb (63.5 kg) Total Weight Gain 46 lb (20.9 kg) Urinalysis: Urine Protein    Urine Glucose    Fetal Status:     Movement: Present     General:  Alert, oriented and cooperative. Patient is in no acute distress.  Skin: Skin is warm and dry. No rash noted.   Cardiovascular: Normal heart rate noted  Respiratory: Normal respiratory effort, no problems with respiration noted  Abdomen: Soft, gravid, appropriate for gestational age. Pain/Pressure: Absent     Pelvic:  Cervical exam deferred        Extremities: Normal range of motion.     Mental Status: Normal mood and affect. Normal behavior. Normal judgment and thought content.   Assessment   45 y.o. [id number] at [redacted]w[redacted]d by  11/04/2021, by Last Menstrual Period presenting for routine prenatal visit  Plan   May 2022 Problems (from 03/19/21 to present)    Problem Noted Resolved   Supervision of  high risk pregnancy, antepartum 03/19/2021 by Lauren Mann, CNM No   Overview Addendum 08/20/2021  5:46 PM by Lauren Mann, CNM     Nursing Staff Provider  Office Location  [facility]   LMP, confirmed [place] 10 wks  Language  English Anatomy US  Complete- echogenic focus  Flu Vaccine   declined Genetic Screen  NIPS:  Normal XY  TDaP vaccine    Hgb A1C or  GTT Early : n/a Third trimester : 114  Rhogam   08/16/21   LAB RESULTS   Feeding Plan  breast Blood Type   B negative  Contraception  Antibody   Negative  Circumcision  Rubella   Immune  Pediatrician   RPR   Non-reactive  Support Person  Lauren Mann - husband HBsAg   Negative  Prenatal Classes  HIV   Non-reactive    Varicella   Immune  BTL Consent  n/a GBS  (For PCN allergy, check sensitivities)        VBAC Consent  n/a Pap  03/19/21 - NILM/neg    Hgb Electro   n/a    CF  negative     SMA  negative                   Preterm labor symptoms and general obstetric precautions including but not limited to vaginal bleeding, contractions, leaking of fluid and fetal movement were reviewed in detail with the patient. Please refer to After Visit Summary for other counseling recommendations.  She declines tdap today, but says she will get it next visit  Return in about 2 weeks (around 09/13/2021) for return OB.  Lauren Mann, CNM  08/30/2021 2:31 PM

## 2021-09-05 ENCOUNTER — Other Ambulatory Visit: Payer: Self-pay

## 2021-09-05 DIAGNOSIS — O0990 Supervision of high risk pregnancy, unspecified, unspecified trimester: Secondary | ICD-10-CM

## 2021-09-05 DIAGNOSIS — O09299 Supervision of pregnancy with other poor reproductive or obstetric history, unspecified trimester: Secondary | ICD-10-CM

## 2021-09-05 DIAGNOSIS — O09521 Supervision of elderly multigravida, first trimester: Secondary | ICD-10-CM

## 2021-09-10 ENCOUNTER — Ambulatory Visit: Payer: 59 | Attending: Maternal & Fetal Medicine

## 2021-09-10 ENCOUNTER — Other Ambulatory Visit: Payer: Self-pay

## 2021-09-10 DIAGNOSIS — O09521 Supervision of elderly multigravida, first trimester: Secondary | ICD-10-CM

## 2021-09-10 DIAGNOSIS — Z3A32 32 weeks gestation of pregnancy: Secondary | ICD-10-CM | POA: Diagnosis not present

## 2021-09-10 DIAGNOSIS — O09213 Supervision of pregnancy with history of pre-term labor, third trimester: Secondary | ICD-10-CM | POA: Insufficient documentation

## 2021-09-10 DIAGNOSIS — O0990 Supervision of high risk pregnancy, unspecified, unspecified trimester: Secondary | ICD-10-CM

## 2021-09-10 DIAGNOSIS — O0993 Supervision of high risk pregnancy, unspecified, third trimester: Secondary | ICD-10-CM | POA: Diagnosis not present

## 2021-09-10 DIAGNOSIS — O09523 Supervision of elderly multigravida, third trimester: Secondary | ICD-10-CM | POA: Diagnosis not present

## 2021-09-10 DIAGNOSIS — O09293 Supervision of pregnancy with other poor reproductive or obstetric history, third trimester: Secondary | ICD-10-CM

## 2021-09-10 DIAGNOSIS — O09299 Supervision of pregnancy with other poor reproductive or obstetric history, unspecified trimester: Secondary | ICD-10-CM

## 2021-09-13 ENCOUNTER — Encounter: Payer: Self-pay | Admitting: Advanced Practice Midwife

## 2021-09-13 ENCOUNTER — Ambulatory Visit (INDEPENDENT_AMBULATORY_CARE_PROVIDER_SITE_OTHER): Payer: 59 | Admitting: Advanced Practice Midwife

## 2021-09-13 ENCOUNTER — Other Ambulatory Visit: Payer: Self-pay

## 2021-09-13 VITALS — BP 120/70 | Wt 186.0 lb

## 2021-09-13 DIAGNOSIS — Z3A32 32 weeks gestation of pregnancy: Secondary | ICD-10-CM

## 2021-09-13 DIAGNOSIS — O0993 Supervision of high risk pregnancy, unspecified, third trimester: Secondary | ICD-10-CM

## 2021-09-13 DIAGNOSIS — O09299 Supervision of pregnancy with other poor reproductive or obstetric history, unspecified trimester: Secondary | ICD-10-CM

## 2021-09-13 DIAGNOSIS — O09523 Supervision of elderly multigravida, third trimester: Secondary | ICD-10-CM

## 2021-09-13 NOTE — Progress Notes (Signed)
Routine Prenatal Care Visit  Subjective  Lauren Mann is a 45 y.o. [id number] at [redacted]w[redacted]d being seen today for ongoing prenatal care.  She is currently monitored for the following issues for this high-risk pregnancy and has Supervision of high risk pregnancy, antepartum; History of pre-eclampsia in prior pregnancy, currently pregnant; and Multigravida of advanced maternal age in first trimester on their problem list.  ----------------------------------------------------------------------------------- Patient reports some trouble sleeping. Comfort measures reviewed.   Contractions: Not present. Vag. Bleeding: None.  Movement: Present. Leaking Fluid denies.  ----------------------------------------------------------------------------------- The following portions of the patient's history were reviewed and updated as appropriate: allergies, current medications, past family history, past medical history, past social history, past surgical history and problem list. Problem list updated.  Objective  Blood pressure 120/70, weight 186 lb (84.4 kg), last menstrual period 01/28/2021. Pregravid weight 140 lb (63.5 kg) Total Weight Gain 46 lb (20.9 kg) Urinalysis: Urine Protein    Urine Glucose    Fetal Status: Fetal Heart Rate (bpm): 142 Fundal Height: 33 cm Movement: Present      MFM growth scan 10/25: 4 pounds 15 ounces, 84%, AFI wnl, f/u growth in 4 weeks, start NSTs at 36 weeks  General:  Alert, oriented and cooperative. Patient is in no acute distress.  Skin: Skin is warm and dry. No rash noted.   Cardiovascular: Normal heart rate noted  Respiratory: Normal respiratory effort, no problems with respiration noted  Abdomen: Soft, gravid, appropriate for gestational age. Pain/Pressure: Absent     Pelvic:  Cervical exam deferred        Extremities: Normal range of motion.  Edema: None  Mental Status: Normal mood and affect. Normal behavior. Normal judgment and thought content.   Assessment   45 y.o.  [id number] at [redacted]w[redacted]d by  11/04/2021, by Last Menstrual Period presenting for routine prenatal visit  Plan   May 2022 Problems (from 03/19/21 to present)    Problem Noted Resolved   Supervision of high risk pregnancy, antepartum 03/19/2021 by Zipporah Plants, CNM No   Overview Addendum 08/20/2021  5:46 PM by Mirna Mires, CNM     Nursing Staff Provider  Office Location  [facility]   LMP, confirmed [place] 10 wks  Language  English Anatomy US  Complete- echogenic focus  Flu Vaccine   declined Genetic Screen  NIPS:  Normal XY  TDaP vaccine    Hgb A1C or  GTT Early : n/a Third trimester : 114  Rhogam   08/16/21   LAB RESULTS   Feeding Plan  breast Blood Type   B negative  Contraception  Antibody   Negative  Circumcision  Rubella   Immune  Pediatrician   RPR   Non-reactive  Support Person  Weston Brass - husband HBsAg   Negative  Prenatal Classes  HIV   Non-reactive    Varicella   Immune  BTL Consent  n/a GBS  (For PCN allergy, check sensitivities)        VBAC Consent  n/a Pap  03/19/21 - NILM/neg    Hgb Electro   n/a    CF  negative     SMA  negative                   Preterm labor symptoms and general obstetric precautions including but not limited to vaginal bleeding, contractions, leaking of fluid and fetal movement were reviewed in detail with the patient. Please refer to After Visit Summary for other counseling recommendations.   Return in about 2  weeks (around 09/27/2021) for rob.  [name], CNM 09/13/2021 3:02 PM

## 2021-09-13 NOTE — Progress Notes (Signed)
ROB

## 2021-09-17 DIAGNOSIS — Z419 Encounter for procedure for purposes other than remedying health state, unspecified: Secondary | ICD-10-CM | POA: Diagnosis not present

## 2021-09-24 ENCOUNTER — Other Ambulatory Visit: Payer: 59

## 2021-09-27 ENCOUNTER — Encounter: Payer: Self-pay | Admitting: Advanced Practice Midwife

## 2021-09-27 ENCOUNTER — Ambulatory Visit (INDEPENDENT_AMBULATORY_CARE_PROVIDER_SITE_OTHER): Payer: 59 | Admitting: Advanced Practice Midwife

## 2021-09-27 ENCOUNTER — Other Ambulatory Visit: Payer: Self-pay

## 2021-09-27 VITALS — BP 142/80 | Wt 191.0 lb

## 2021-09-27 DIAGNOSIS — O0993 Supervision of high risk pregnancy, unspecified, third trimester: Secondary | ICD-10-CM

## 2021-09-27 DIAGNOSIS — Z3A34 34 weeks gestation of pregnancy: Secondary | ICD-10-CM

## 2021-09-27 DIAGNOSIS — O09299 Supervision of pregnancy with other poor reproductive or obstetric history, unspecified trimester: Secondary | ICD-10-CM

## 2021-09-27 DIAGNOSIS — O09523 Supervision of elderly multigravida, third trimester: Secondary | ICD-10-CM

## 2021-09-27 NOTE — Progress Notes (Signed)
ROB

## 2021-09-27 NOTE — Patient Instructions (Signed)
Hypertension During Pregnancy High blood pressure (hypertension) is when the force of blood pumping through the arteries is high enough to cause problems with your health. Arteries are blood vessels that carry blood from the heart throughout the body. Hypertension during pregnancy can cause problems for you and your baby. It can be mild or severe. There are different types of hypertension that can happen during pregnancy. These include: Chronic hypertension. This happens when you had high blood pressure before you became pregnant, and it continues during the pregnancy. Hypertension that develops before you are [redacted] weeks pregnant and continues during the pregnancy is also called chronic hypertension. If you have chronic hypertension, it will not go away after you have your baby. You will need follow-up visits with your health care provider after you have your baby. Your health care provider may want you to keep taking medicine for your blood pressure. Gestational hypertension. This is hypertension that develops after the 20th week of pregnancy. Gestational hypertension usually goes away after you have your baby, but your health care provider will need to monitor your blood pressure to make sure that it is getting better. Postpartum hypertension. This is high blood pressure that was present before delivery and continues after delivery or that starts after delivery. This usually occurs within 48 hours after childbirth but may occur up to 6 weeks after giving birth. When hypertension during pregnancy is severe, it is a medical emergency that requires treatment right away. How does this affect me? Women who have hypertension during pregnancy have a greater chance of developing hypertension later in life or during future pregnancies. In some cases, hypertension during pregnancy can cause serious complications, such as: Stroke. Heart attack. Injury to other organs, such as kidneys, lungs, or  liver. Preeclampsia. A condition called hemolysis, elevated liver enzymes, and low platelet count (HELLP) syndrome. Convulsions or seizures. Placental abruption. How does this affect my baby? Hypertension during pregnancy can affect your baby. Your baby may: Be born early (prematurely). Not weigh as much as he or she should at birth (low birth weight). Not tolerate labor well, leading to an unplanned cesarean delivery. This condition may also result in a baby's death before birth (stillbirth). What are the risks? There are certain factors that make it more likely for you to develop hypertension during pregnancy. These include: Having hypertension during a previous pregnancy or a family history of hypertension. Being overweight. Being age 35 or older. Being pregnant for the first time. Being pregnant with more than one baby. Becoming pregnant using fertilization methods, such as IVF (in vitro fertilization). Having other medical problems, such as diabetes, kidney disease, or lupus. What can I do to lower my risk? The exact cause of hypertension during pregnancy is not known. You may be able to lower your risk by: Maintaining a healthy weight. Eating a healthy and balanced diet. Following your health care provider's instructions about treating any long-term conditions that you had before becoming pregnant. It is very important to keep all of your prenatal care appointments. Your health care provider will check your blood pressure and make sure that your pregnancy is progressing as expected. If a problem is found, early treatment can prevent complications. How is this treated? Treatment for hypertension during pregnancy varies depending on the type of hypertension you have and how serious it is. If you were taking medicine for high blood pressure before you became pregnant, talk with your health care provider. You may need to change medicine during pregnancy because some   medicines, like ACE  inhibitors, may not be considered safe for your baby. If you have gestational hypertension, your health care provider may order medicine to treat this during pregnancy. If you are at risk for preeclampsia, your health care provider may recommend that you take a low-dose aspirin during your pregnancy. If you have severe hypertension, you may need to be hospitalized so you and your baby can be monitored closely. You may also need to be given medicine to lower your blood pressure. In some cases, if your condition gets worse, you may need to deliver your baby early. Follow these instructions at home: Eating and drinking  Drink enough fluid to keep your urine pale yellow. Avoid caffeine. Lifestyle Do not use any products that contain nicotine or tobacco. These products include cigarettes, chewing tobacco, and vaping devices, such as e-cigarettes. If you need help quitting, ask your health care provider. Do not use alcohol or drugs. Avoid stress as much as possible. Rest and get plenty of sleep. Regular exercise can help to reduce your blood pressure. Ask your health care provider what kinds of exercise are best for you. General instructions Take over-the-counter and prescription medicines only as told by your health care provider. Keep all prenatal and follow-up visits. This is important. Contact a health care provider if: You have symptoms that your health care provider told you may require more treatment or monitoring, such as: Headaches. Nausea or vomiting. Abdominal pain. Dizziness. Light-headedness. Get help right away if: You have symptoms of serious complications, such as: Severe abdominal pain that does not get better with treatment. A severe headache that does not get better, blurred vision, or double vision. Vomiting that does not get better. Sudden, rapid weight gain or swelling in your hands, ankles, or face. Vaginal bleeding. Blood in your urine. Shortness of breath or chest  pain. Weakness on one side of your body or difficulty speaking. Your baby is not moving as much as usual. These symptoms may represent a serious problem that is an emergency. Do not wait to see if the symptoms will go away. Get medical help right away. Call your local emergency services (911 in the [place] Do not drive yourself to the hospital. Summary Hypertension during pregnancy can cause problems for you and your baby. Treatment for hypertension during pregnancy varies depending on the type of hypertension you have and how serious it is. Keep all prenatal and follow-up visits. This is important. Get help right away if you have symptoms of serious complications related to high blood pressure. This information is not intended to replace advice given to you by your health care provider. Make sure you discuss any questions you have with your health care provider. Document Revised: 07/26/2020 Document Reviewed: 07/26/2020 Elsevier Patient Education  2022 [employer]  

## 2021-09-27 NOTE — Progress Notes (Signed)
Routine Prenatal Care Visit  Subjective  Lauren Mann is a 45 y.o. [id number] at [redacted]w[redacted]d being seen today for ongoing prenatal care.  She is currently monitored for the following issues for this high-risk pregnancy and has Supervision of high risk pregnancy, antepartum; History of pre-eclampsia in prior pregnancy, currently pregnant; and Multigravida of advanced maternal age in first trimester on their problem list.  ----------------------------------------------------------------------------------- Patient reports  being upset earlier at work and thinks that may have caused her elevated BP. She denies headache, visual changes or epigastric pain. We will check PIH labs today and plan to do GBS in 1 week. She has MFM follow up at the end of this month .   Contractions: Not present. Vag. Bleeding: None.  Movement: Present. Leaking Fluid denies.  ----------------------------------------------------------------------------------- The following portions of the patient's history were reviewed and updated as appropriate: allergies, current medications, past family history, past medical history, past social history, past surgical history and problem list. Problem list updated.  Objective  Blood pressure (!) 142/80, weight 191 lb (86.6 kg), last menstrual period 01/28/2021. Pregravid weight 140 lb (63.5 kg) Total Weight Gain 51 lb (23.1 kg) Urinalysis: Urine Protein    Urine Glucose    Fetal Status: Fetal Heart Rate (bpm): 147 Fundal Height: 35 cm Movement: Present     General:  Alert, oriented and cooperative. Patient is in no acute distress.  Skin: Skin is warm and dry. No rash noted.   Cardiovascular: Normal heart rate noted  Respiratory: Normal respiratory effort, no problems with respiration noted  Abdomen: Soft, gravid, appropriate for gestational age. Pain/Pressure: Absent     Pelvic:  Cervical exam deferred        Extremities: Normal range of motion.  Edema: None  Mental Status: Normal mood and  affect. Normal behavior. Normal judgment and thought content.   Assessment   45 y.o. [id number] at [redacted]w[redacted]d by  11/04/2021, by Last Menstrual Period presenting for routine prenatal visit  Plan   May 2022 Problems (from 03/19/21 to present)     Problem Noted Resolved   Supervision of high risk pregnancy, antepartum 03/19/2021 by Orlie Pollen, CNM No   Overview Addendum 08/20/2021  5:46 PM by Imagene Riches, CNM     Nursing Staff Provider  Office Location  [facility] Dating   LMP, confirmed [place] 10 wks  Language  English Anatomy US  Complete- echogenic focus  Flu Vaccine   declined Genetic Screen  NIPS:  Normal XY  TDaP vaccine    Hgb A1C or  GTT Early : n/a Third trimester : 114  Rhogam   08/16/21   LAB RESULTS   Feeding Plan  breast Blood Type   B negative  Contraception  Antibody   Negative  Circumcision  Rubella   Immune  Pediatrician   RPR   Non-reactive  Support Person  [name] - husband HBsAg   Negative  Prenatal Classes  HIV   Non-reactive    Varicella   Immune  BTL Consent  n/a GBS  (For PCN allergy, check sensitivities)        VBAC Consent  n/a Pap  03/19/21 - NILM/neg    Hgb Electro   n/a    CF  negative     SMA  negative                 CBC, CMP, P/C ratio today   Preterm labor symptoms and general obstetric precautions including but not limited to vaginal bleeding, contractions, leaking of  fluid and fetal movement were reviewed in detail with the patient. Please refer to After Visit Summary for other counseling recommendations.   Return in about 1 week (around 10/04/2021) for rob/collect gbs.  [name], CNM 09/27/2021 3:49 PM

## 2021-09-28 LAB — COMPREHENSIVE METABOLIC PANEL
ALT: 6 IU/L (ref 0–32)
AST: 11 IU/L (ref 0–40)
Albumin/Globulin Ratio: 1.4 (ref 1.2–2.2)
Albumin: 3.5 g/dL — ABNORMAL LOW (ref 3.8–4.8)
Alkaline Phosphatase: 92 IU/L (ref 44–121)
BUN/Creatinine Ratio: 20 (ref 9–23)
BUN: 10 mg/dL (ref 6–24)
Bilirubin Total: <0.2 mg/dL (ref 0.0–1.2)
CO2: 21 mmol/L (ref 20–29)
Calcium: 9.6 mg/dL (ref 8.7–10.2)
Chloride: 101 mmol/L (ref 96–106)
Creatinine, Ser: 0.49 mg/dL — ABNORMAL LOW (ref 0.57–1.00)
Globulin, Total: 2.5 g/dL (ref 1.5–4.5)
Glucose: 88 mg/dL (ref 70–99)
Potassium: 3.9 mmol/L (ref 3.5–5.2)
Sodium: 137 mmol/L (ref 134–144)
Total Protein: 6 g/dL (ref 6.0–8.5)
eGFR: 120 mL/min/{1.73_m2} (ref >59)

## 2021-09-28 LAB — PROTEIN / CREATININE RATIO, URINE
Creatinine, Urine: 42.8 mg/dL
Protein, Ur: 13.1 mg/dL
Protein/Creat Ratio: 306 mg/g creat — ABNORMAL HIGH (ref 0–200)

## 2021-09-28 LAB — CBC
Hematocrit: 32.2 % — ABNORMAL LOW (ref 34.0–46.6)
Hemoglobin: 11.3 g/dL (ref 11.1–15.9)
MCH: 32.2 pg (ref 26.6–33.0)
MCHC: 35.1 g/dL (ref 31.5–35.7)
MCV: 92 fL (ref 79–97)
Platelets: 245 10*3/uL (ref 150–450)
RBC: 3.51 x10E6/uL — ABNORMAL LOW (ref 3.77–5.28)
RDW: 12.6 % (ref 11.7–15.4)
WBC: 8.5 10*3/uL (ref 3.4–10.8)

## 2021-09-30 NOTE — Progress Notes (Signed)
Update to 09/27/21 visit:

## 2021-09-30 NOTE — Progress Notes (Signed)
Update to 09/27/21 visit:  Results for FATIM, VANDERSCHAAF (MRN [id number]) as of 09/30/2021 10:57  Ref. Range 09/27/2021 15:28 09/27/2021 15:49  COMPREHENSIVE METABOLIC PANEL Unknown  Rpt (A)  Sodium Latest Ref Range: 134 - 144 mmol/L  137  Potassium Latest Ref Range: 3.5 - 5.2 mmol/L  3.9  Chloride Latest Ref Range: 96 - 106 mmol/L  101  CO2 Latest Ref Range: 20 - 29 mmol/L  21  Glucose Latest Ref Range: 70 - 99 mg/dL  88  BUN Latest Ref Range: 6 - 24 mg/dL  10  Creatinine Latest Ref Range: 0.57 - 1.00 mg/dL  0.49 (L)  Calcium Latest Ref Range: 8.7 - 10.2 mg/dL  9.6  BUN/Creatinine Ratio Latest Ref Range: 9 - 23   20  eGFR Latest Ref Range: >59 mL/min/1.73  120  Alkaline Phosphatase Latest Ref Range: 44 - 121 IU/L  92  Albumin Latest Ref Range: 3.8 - 4.8 g/dL  3.5 (L)  Albumin/Globulin Ratio Latest Ref Range: 1.2 - 2.2   1.4  AST Latest Ref Range: 0 - 40 IU/L  11  ALT Latest Ref Range: 0 - 32 IU/L  6  Total Protein Latest Ref Range: 6.0 - 8.5 g/dL  6.0  Total Bilirubin Latest Ref Range: 0.0 - 1.2 mg/dL  <0.2  Globulin, Total Latest Ref Range: 1.5 - 4.5 g/dL  2.5  WBC Latest Ref Range: 3.4 - 10.8 x10E3/uL  8.5  RBC Latest Ref Range: 3.77 - 5.28 x10E6/uL  3.51 (L)  Hemoglobin Latest Ref Range: 11.1 - 15.9 g/dL  11.3  HCT Latest Ref Range: 34.0 - 46.6 %  32.2 (L)  MCV Latest Ref Range: 79 - 97 fL  92  MCH Latest Ref Range: 26.6 - 33.0 pg  32.2  MCHC Latest Ref Range: 31.5 - 35.7 g/dL  35.1  RDW Latest Ref Range: 11.7 - 15.4 %  12.6  Platelets Latest Ref Range: 150 - 450 x10E3/uL  245  Protein Urine Random Latest Ref Range: Not Estab. mg/dL 13.1   Protein/Creat Ratio Latest Ref Range: 0 - 200 mg/g creat 306 (H)   Creatinine, Urine Latest Ref Range: Not Estab. mg/dL 42.8    Discussed with Dr Gilman Schmidt who recommends; manage outpatient, twice a week BP checks, delivery at 37 weeks.  Will notify patient and have her scheduled for twice weekly office visits- BP checks. She has an appointment  tomorrow with Lind Covert.    Rod Can, CNM

## 2021-09-30 NOTE — H&P (Signed)
OB History & Physical   History of Present Illness:  Date of Initial H&P: 09/30/2021  Chief Complaint: planned induction of labor for preeclampsia  HPI:  Lauren Mann is a 45 y.o. [id number] female at 63w0ddated by LMP.  Her pregnancy has been complicated by Advanced Maternal Age, history of preeclampsia with severe features, history of preterm labor/delivery .    She denies contractions.   She denies leakage of fluid.   She denies vaginal bleeding.   She reports fetal movement.    Total weight gain for pregnancy: 51 lb (23.1 kg)   Obstetrical Problem List: May 2022 Problems (from 03/19/21 to present)     Problem Noted Resolved   Supervision of high risk pregnancy, antepartum 03/19/2021 by VOrlie Pollen CNM No   Overview Addendum 08/20/2021  5:46 PM by FImagene Riches CNM     Nursing Staff Provider  Office Location  [facility] Dating   LMP, confirmed [place]10 wks  Language  English Anatomy [place] Complete- echogenic focus  Flu Vaccine   declined Genetic Screen  NIPS:  Normal XY  TDaP vaccine    Hgb A1C or  GTT Early : n/a Third trimester : 114  Rhogam   08/16/21   LAB RESULTS   Feeding Plan  breast Blood Type   B negative  Contraception  Antibody   Negative  Circumcision  Rubella   Immune  Pediatrician   RPR   Non-reactive  Support Person  NMerrilee Seashore- husband HBsAg   Negative  Prenatal Classes  HIV   Non-reactive    Varicella   Immune  BTL Consent  n/a GBS  (For PCN allergy, check sensitivities)        VBAC Consent  n/a Pap  03/19/21 - NILM/neg    Hgb Electro   n/a    CF  negative     SMA  negative                   Maternal Medical History:   Past Medical History:  Diagnosis Date   History of chicken pox    History of kidney stones    History of UTI    Hyperlipidemia     Past Surgical History:  Procedure Laterality Date   NO PAST SURGERIES      Allergies  Allergen Reactions   Caffeine Palpitations    Prior to Admission medications   Medication Sig Start Date  End Date Taking? Authorizing Provider  aspirin EC 81 MG tablet Take 81 mg by mouth daily. Swallow whole.   Yes [provider]  Prenatal Vit-Fe Fumarate-FA (MULTIVITAMIN-PRENATAL) 27-0.8 MG TABS tablet Take 1 tablet by mouth daily at 12 noon.   Yes [provider]  UNABLE TO FIND Take 1 tablet by mouth every other day. Probiotic    [provider]    OB History  Gravida Para Term Preterm AB Living  _0 SAB IAB Ectopic Multiple Live Births  1       2    # Outcome Date GA Lbr Len/2nd Weight Sex Delivery Anes PTL Lv  4 Current           3 SAB 05/24/18    U SAB     2 Preterm 12/02/14 316w0d5 lb 3 oz (2.353 kg) M Vag-Spont   LIV     Birth Comments: PREECLAMPSIA-IOL  1 Preterm 07/22/98 3641w0d lb 5 oz (2.41 kg) M  Vag-Spont   LIV    Prenatal care site: [facility]  Social History: She  reports that she quit smoking about 12 years ago. Her smoking use included cigarettes. She has never used smokeless tobacco. She reports that she does not currently use alcohol. She reports that she does not currently use drugs after having used the following drugs: Marijuana.  Family History: family history includes Arthritis in her maternal grandmother; Breast cancer in her paternal aunt; Diabetes in her maternal grandmother; Heart disease in her father and paternal grandfather; Hyperlipidemia in her father; Kidney disease in her maternal grandmother; Mental illness in her sister.    Review of Systems:  Review of Systems  Constitutional:  Negative for chills and fever.  HENT:  Negative for congestion, ear discharge, ear pain, hearing loss, sinus pain and sore throat.   Eyes:  Negative for blurred vision and double vision.  Respiratory:  Negative for cough, shortness of breath and wheezing.   Cardiovascular:  Negative for chest pain, palpitations and leg swelling.  Gastrointestinal:  Negative for abdominal pain, blood in stool, constipation, diarrhea, heartburn,  melena, nausea and vomiting.  Genitourinary:  Negative for dysuria, flank pain, frequency, hematuria and urgency.  Musculoskeletal:  Negative for back pain, joint pain and myalgias.  Skin:  Negative for itching and rash.  Neurological:  Negative for dizziness, tingling, tremors, sensory change, speech change, focal weakness, seizures, loss of consciousness, weakness and headaches.  Endo/Heme/Allergies:  Negative for environmental allergies. Does not bruise/bleed easily.  Psychiatric/Behavioral:  Negative for depression, hallucinations, memory loss, substance abuse and suicidal ideas. The patient is not nervous/anxious and does not have insomnia.     Physical Exam:  BP (!) 142/80   Wt 191 lb (86.6 kg)   LMP 01/28/2021   BMI 31.78 kg/m   Constitutional: Well nourished, well developed female in no acute distress.  HEENT: normal Skin: Warm and dry.  Cardiovascular: Regular rate and rhythm.   Extremity:  no edema   Respiratory: Clear to auscultation bilateral. Normal respiratory effort Abdomen: FHT present Neuro: DTRs 2+, Cranial nerves grossly intact Psych: Alert and Oriented x3. No memory deficits. Normal mood and affect.    Results for JERZEE, JEROME (MRN [id number]) as of 09/30/2021 16:35  Ref. Range 09/27/2021 15:28 09/27/2021 15:49  Sodium Latest Ref Range: 134 - 144 mmol/L  137  Potassium Latest Ref Range: 3.5 - 5.2 mmol/L  3.9  Chloride Latest Ref Range: 96 - 106 mmol/L  101  CO2 Latest Ref Range: 20 - 29 mmol/L  21  Glucose Latest Ref Range: 70 - 99 mg/dL  88  BUN Latest Ref Range: 6 - 24 mg/dL  10  Creatinine Latest Ref Range: 0.57 - 1.00 mg/dL  0.49 (L)  Calcium Latest Ref Range: 8.7 - 10.2 mg/dL  9.6  BUN/Creatinine Ratio Latest Ref Range: 9 - 23   20  eGFR Latest Ref Range: >59 mL/min/1.73  120  Alkaline Phosphatase Latest Ref Range: 44 - 121 IU/L  92  Albumin Latest Ref Range: 3.8 - 4.8 g/dL  3.5 (L)  Albumin/Globulin Ratio Latest Ref Range: 1.2 - 2.2   1.4  AST Latest  Ref Range: 0 - 40 IU/L  11  ALT Latest Ref Range: 0 - 32 IU/L  6  Total Protein Latest Ref Range: 6.0 - 8.5 g/dL  6.0  Total Bilirubin Latest Ref Range: 0.0 - 1.2 mg/dL  <0.2  Globulin, Total Latest Ref Range: 1.5 - 4.5 g/dL  2.5  WBC Latest Ref  Range: 3.4 - 10.8 x10E3/uL  8.5  RBC Latest Ref Range: 3.77 - 5.28 x10E6/uL  3.51 (L)  Hemoglobin Latest Ref Range: 11.1 - 15.9 g/dL  11.3  HCT Latest Ref Range: 34.0 - 46.6 %  32.2 (L)  MCV Latest Ref Range: 79 - 97 fL  92  MCH Latest Ref Range: 26.6 - 33.0 pg  32.2  MCHC Latest Ref Range: 31.5 - 35.7 g/dL  35.1  RDW Latest Ref Range: 11.7 - 15.4 %  12.6  Platelets Latest Ref Range: 150 - 450 x10E3/uL  245  Protein Urine Random Latest Ref Range: Not Estab. mg/dL 13.1   Protein/Creat Ratio Latest Ref Range: 0 - 200 mg/g creat 306 (H)   Creatinine, Urine Latest Ref Range: Not Estab. mg/dL 42.8     No results found for: SARSCOV2NAA Covid results pending  Assessment:  Lauren Mann is a 45 y.o. [id number] female at 21w0dwith preeclampsia without severe features.   Plan:  Admit to Labor & Delivery  CBC, CMP, P/C Ratio, T&S, Clrs, IVF GBS result pending.   Fetal well-being: Reassuring Preeclampsia: monitor for severe symptoms, IV labetalol protocol PRN, IV magnesium sulfate PRN Begin induction based on admission exam   JRod Can CNM 09/30/2021 4:31 PM

## 2021-09-30 NOTE — Addendum Note (Signed)
Addended by: [name] on: 09/30/2021 04:57 PM   Modules accepted: Orders, SmartSet

## 2021-10-01 ENCOUNTER — Other Ambulatory Visit: Payer: Self-pay

## 2021-10-01 ENCOUNTER — Ambulatory Visit (INDEPENDENT_AMBULATORY_CARE_PROVIDER_SITE_OTHER): Payer: 59 | Admitting: Obstetrics

## 2021-10-01 VITALS — BP 140/86 | Wt 192.0 lb

## 2021-10-01 DIAGNOSIS — O0990 Supervision of high risk pregnancy, unspecified, unspecified trimester: Secondary | ICD-10-CM

## 2021-10-01 DIAGNOSIS — Z3A35 35 weeks gestation of pregnancy: Secondary | ICD-10-CM

## 2021-10-01 LAB — POCT URINALYSIS DIPSTICK OB
Glucose, UA: NEGATIVE
POC,PROTEIN,UA: NEGATIVE

## 2021-10-01 NOTE — Progress Notes (Signed)
  Routine Prenatal Care Visit  Subjective  Lauren Mann is a 45 y.o. [id number] at [redacted]w[redacted]d being seen today for ongoing prenatal care.  She is currently monitored for the following issues for this high-risk pregnancy and has Supervision of high risk pregnancy, antepartum; History of pre-eclampsia in prior pregnancy, currently pregnant; and Multigravida of advanced maternal age in first trimester on their problem list.  ----------------------------------------------------------------------------------- Patient reports no complaints.    .  .   Pincus Large Fluid denies.  ----------------------------------------------------------------------------------- The following portions of the patient's history were reviewed and updated as appropriate: allergies, current medications, past family history, past medical history, past social history, past surgical history and problem list. Problem list updated.  Objective  Blood pressure 140/86, weight 192 lb (87.1 kg), last menstrual period 01/28/2021. Pregravid weight 140 lb (63.5 kg) Total Weight Gain 52 lb (23.6 kg) Urinalysis: Urine Protein Negative  Urine Glucose Negative  Fetal Status:           General:  Alert, oriented and cooperative. Patient is in no acute distress.  Skin: Skin is warm and dry. No rash noted.   Cardiovascular: Normal heart rate noted  Respiratory: Normal respiratory effort, no problems with respiration noted  Abdomen: Soft, gravid, appropriate for gestational age.       Pelvic:  Cervical exam deferred        Extremities: Normal range of motion.     Mental Status: Normal mood and affect. Normal behavior. Normal judgment and thought content.   Assessment   45 y.o. [id number] at [redacted]w[redacted]d by  11/04/2021, by Last Menstrual Period presenting for routine prenatal visit  Plan   May 2022 Problems (from 03/19/21 to present)    Problem Noted Resolved   Supervision of high risk pregnancy, antepartum 03/19/2021 by Zipporah Plants, CNM No   Overview  Addendum 08/20/2021  5:46 PM by Mirna Mires, CNM     Nursing Staff Provider  Office Location  [facility] Dating   LMP, confirmed [place] 10 wks  Language  English Anatomy US  Complete- echogenic focus  Flu Vaccine   declined Genetic Screen  NIPS:  Normal XY  TDaP vaccine    Hgb A1C or  GTT Early : n/a Third trimester : 114  Rhogam   08/16/21   LAB RESULTS   Feeding Plan  breast Blood Type   B negative  Contraception  Antibody   Negative  Circumcision  Rubella   Immune  Pediatrician   RPR   Non-reactive  Support Person  Weston Brass - husband HBsAg   Negative  Prenatal Classes  HIV   Non-reactive    Varicella   Immune  BTL Consent  n/a GBS  (For PCN allergy, check sensitivities)        VBAC Consent  n/a Pap  03/19/21 - NILM/neg    Hgb Electro   n/a    CF  negative     SMA  negative                   Term labor symptoms and general obstetric precautions including but not limited to vaginal bleeding, contractions, leaking of fluid and fetal movement were reviewed in detail with the patient. Please refer to After Visit Summary for other counseling recommendations.    Return in about 1 week (around 10/08/2021) for return OB.  Mirna Mires, CNM  10/01/2021 3:28 PM

## 2021-10-01 NOTE — Progress Notes (Signed)
ROB

## 2021-10-04 ENCOUNTER — Ambulatory Visit (INDEPENDENT_AMBULATORY_CARE_PROVIDER_SITE_OTHER): Payer: 59 | Admitting: Obstetrics and Gynecology

## 2021-10-04 ENCOUNTER — Encounter: Payer: Self-pay | Admitting: Obstetrics and Gynecology

## 2021-10-04 ENCOUNTER — Other Ambulatory Visit: Payer: Self-pay | Admitting: *Deleted

## 2021-10-04 ENCOUNTER — Other Ambulatory Visit: Payer: Self-pay

## 2021-10-04 VITALS — BP 136/84 | Wt 192.0 lb

## 2021-10-04 DIAGNOSIS — O0993 Supervision of high risk pregnancy, unspecified, third trimester: Secondary | ICD-10-CM | POA: Diagnosis not present

## 2021-10-04 DIAGNOSIS — O1493 Unspecified pre-eclampsia, third trimester: Secondary | ICD-10-CM | POA: Diagnosis not present

## 2021-10-04 DIAGNOSIS — Z3A35 35 weeks gestation of pregnancy: Secondary | ICD-10-CM

## 2021-10-04 DIAGNOSIS — O09299 Supervision of pregnancy with other poor reproductive or obstetric history, unspecified trimester: Secondary | ICD-10-CM

## 2021-10-04 DIAGNOSIS — O09523 Supervision of elderly multigravida, third trimester: Secondary | ICD-10-CM

## 2021-10-04 NOTE — Progress Notes (Signed)
Routine Prenatal Care Visit  Subjective  Lauren Mann is a 45 y.o. [id number] at [redacted]w[redacted]d being seen today for ongoing prenatal care.  She is currently monitored for the following issues for this high-risk pregnancy and has Supervision of high risk pregnancy, antepartum; History of pre-eclampsia in prior pregnancy, currently pregnant; Multigravida of advanced maternal age in first trimester; and Preeclampsia, third trimester on their problem list.  ----------------------------------------------------------------------------------- Patient reports no complaints.  Denies HA, visual changes, and RUQ pain Contractions: Not present. Vag. Bleeding: None.  Movement: Present. Leaking Fluid denies.  ----------------------------------------------------------------------------------- The following portions of the patient's history were reviewed and updated as appropriate: allergies, current medications, past family history, past medical history, past social history, past surgical history and problem list. Problem list updated.  Objective  Blood pressure 136/84, weight 192 lb (87.1 kg), last menstrual period 01/28/2021. Pregravid weight 140 lb (63.5 kg) Total Weight Gain 52 lb (23.6 kg) Urinalysis: Urine Protein    Urine Glucose    Fetal Status: Fetal Heart Rate (bpm): 130   Movement: Present     General:  Alert, oriented and cooperative. Patient is in no acute distress.  Skin: Skin is warm and dry. No rash noted.   Cardiovascular: Normal heart rate noted  Respiratory: Normal respiratory effort, no problems with respiration noted  Abdomen: Soft, gravid, appropriate for gestational age. Pain/Pressure: Absent     Pelvic:  Cervical exam deferred        Extremities: Normal range of motion.  Edema: None  Mental Status: Normal mood and affect. Normal behavior. Normal judgment and thought content.   NST: Baseline FHR: 130 beats/min Variability: moderate Accelerations: present Decelerations:  absent Tocometry: not done  Interpretation:  INDICATIONS: preeclampsia without severe features RESULTS:  A NST procedure was performed with FHR monitoring and a normal baseline established, appropriate time of 20-40 minutes of evaluation, and accels >2 seen w 15x15 characteristics.  Results show a REACTIVE NST.    Assessment   45 y.o. [id number] at [redacted]w[redacted]d by  11/04/2021, by Last Menstrual Period presenting for routine prenatal visit  Plan   May 2022 Problems (from 03/19/21 to present)     Problem Noted Resolved   Preeclampsia, third trimester 10/04/2021 by Conard Novak, MD No   Supervision of high risk pregnancy, antepartum 03/19/2021 by Zipporah Plants, CNM No   Overview Addendum 08/20/2021  5:46 PM by Mirna Mires, CNM     Nursing Staff Provider  Office Location  [facility] Dating   LMP, confirmed [place] 10 wks  Language  English Anatomy US  Complete- echogenic focus  Flu Vaccine   declined Genetic Screen  NIPS:  Normal XY  TDaP vaccine    Hgb A1C or  GTT Early : n/a Third trimester : 114  Rhogam   08/16/21   LAB RESULTS   Feeding Plan  breast Blood Type   B negative  Contraception  Antibody   Negative  Circumcision  Rubella   Immune  Pediatrician   RPR   Non-reactive  Support Person  Weston Brass - husband HBsAg   Negative  Prenatal Classes  HIV   Non-reactive    Varicella   Immune  BTL Consent  n/a GBS  (For PCN allergy, check sensitivities)        VBAC Consent  n/a Pap  03/19/21 - NILM/neg    Hgb Electro   n/a    CF  negative     SMA  negative  Preterm labor symptoms and general obstetric precautions including but not limited to vaginal bleeding, contractions, leaking of fluid and fetal movement were reviewed in detail with the patient. Please refer to After Visit Summary for other counseling recommendations.   Return in about 4 days (around 10/08/2021) for ROB, NST.   Thomasene Mohair, MD, [facility], [facility] 10/04/2021  4:37 PM

## 2021-10-04 NOTE — Patient Outreach (Signed)
Medicaid Managed Care   Nurse Care Manager Note  10/04/2021 Name:  Lauren Mann MRN:  [id number] DOB:  November 01, 1976  Lauren Mann is an 45 y.o. year old female who is a primary patient of Tommie Sams, DO.  The [facility] Managed Care Coordination team was consulted for assistance with:    Obstetrics healthcare management needs  Ms. Carias was given information about Medicaid Managed Care Coordination team services today. Leandro Reasoner Righter Patient agreed to services and verbal consent obtained.  Engaged with patient by telephone for initial visit in response to provider referral for case management and/or care coordination services.   Assessments/Interventions:  Review of past medical history, allergies, medications, health status, including review of consultants reports, laboratory and other test data, was performed as part of comprehensive evaluation and provision of chronic care management services.  SDOH (Social Determinants of Health) assessments and interventions performed: SDOH Interventions    Flowsheet Row Most Recent Value  SDOH Interventions   Food Insecurity Interventions Intervention Not Indicated  Housing Interventions Intervention Not Indicated  Transportation Interventions Intervention Not Indicated       Care Plan  Allergies  Allergen Reactions   Caffeine Palpitations    Medications Reviewed Today     Reviewed by Heidi Dach, RN (Registered Nurse) on 10/04/21 at 1457  Med List Status: <None>   Medication Order Taking? Sig Documenting Provider Last Dose Status Informant  aspirin EC 81 MG tablet [id number] Yes Take 81 mg by mouth daily. Swallow whole. [provider] Taking Active   Prenatal Vit-Fe Fumarate-FA (MULTIVITAMIN-PRENATAL) 27-0.8 MG TABS tablet [id number] Yes Take 1 tablet by mouth daily at 12 noon. [provider] Taking Active Self  UNABLE TO FIND [id number] No Take 1 tablet by mouth every other day. Probiotic  Patient not taking:  Reported on 10/04/2021   [provider] Not Taking Active             Patient Active Problem List   Diagnosis Date Noted   Multigravida of advanced maternal age in first trimester 04/12/2021   Supervision of high risk pregnancy, antepartum 03/19/2021   History of pre-eclampsia in prior pregnancy, currently pregnant 03/19/2021    Conditions to be addressed/monitored per PCP order:   Obstetrical health management  Care Plan : RN Care Manager Plan of Care  Updates made by Heidi Dach, RN since 10/04/2021 12:00 AM     Problem: Healthcare Management Needs in OB Patient with Pre-eclampsia   Priority: High     Long-Range Goal: Development of Plan of Care to address needs and knowledge deficits in OB Patient with Pre-eclampsia   Start Date: 10/04/2021  Expected End Date: 12/03/2021  Priority: High  Note:   Current Barriers:  Knowledge Deficits related to plan of care for management of Pre-eclampsia   RNCM Clinical Goal(s):  Patient will verbalize understanding of plan for management of Pre-eclampsia as evidenced by verbalization and self monitoring take all medications exactly as prescribed and will call provider for medication related questions as evidenced by documentation in EMR    attend all scheduled medical appointments: 10/04/21 at 3:50 pm and 10/08/21 at 1:50 pm with OB provider as evidenced by physician documentation in EMR            Interventions: Inter-disciplinary care team collaboration (see longitudinal plan of care) Evaluation of current treatment plan related to  self management and patient's adherence to plan as established by provider Assessed for SDOH needs  Pre-eclampsia :  (  Status: New goal.) Long Term Goal  Recommended adequate rest     Reviewed provider prescribed activity limitations    Advised to follow routine screening guidelines as outlined by obstetrics provider    Advised to monitor BP as directed by OB provider Contact OB provider  with questions or concerns Decrease salt intake Reviewed Fetal Kick Counts  Patient Goals/Self-Care Activities: Patient will self administer medications as prescribed as evidenced by self report/primary caregiver report  Patient will attend all scheduled provider appointments as evidenced by clinician review of documented attendance to scheduled appointments and patient/caregiver report Patient will continue to perform ADL's independently as evidenced by patient/caregiver report Patient will continue to perform IADL's independently as evidenced by patient/caregiver report Patient will call provider office for new concerns or questions as evidenced by review of documented incoming telephone call notes and patient report       Follow Up:  Patient agrees to Care Plan and Follow-up.  Plan: The Managed Medicaid care management team will reach out to the patient again over the next 14 days.  Date/time of next scheduled RN care management/care coordination outreach:  10/14/21 @ 10:30am  Estanislado Emms RN, BSN [place]  [facility] Care Coordinator

## 2021-10-04 NOTE — Patient Instructions (Signed)
Visit Information  Ms. Chabot was given information about Medicaid Managed Care team care coordination services as a part of their [facility] Medicaid benefit. Leandro Reasoner [place] verbally consented to engagement with the [facility] Managed Care team.   If you are experiencing a medical emergency, please call 911 or report to your local emergency department or urgent care.   If you have a non-emergency medical problem during routine business hours, please contact your provider's office and ask to speak with a nurse.   For questions related to your [facility] health plan, please call: [phone] or go here:[email or web address]  If you would like to schedule transportation through your [facility] plan, please call the following number at least 2 days in advance of your appointment: [phone].  Call the [facility] Crisis Line at [phone], at any time, 24 hours a day, 7 days a week. If you are in danger or need immediate medical attention call 911.  If you would like help to quit smoking, call 1-800-QUIT-NOW ((219)080-4840) OR Espaol: 1-855-Djelo-Ya (3-143-888-7579) o para ms informacin haga clic aqu or Text READY to 728-206 to register via text  Ms. Uppal - following are the goals we discussed in your visit today:   Goals Addressed   None     Please see education materials related to pregnancy provided by MyChart link.  Patient has access to MyChart and can view provided education  Telephone follow up appointment with Managed Medicaid care management team member scheduled for:11/13/21 @ 10:30am  Estanislado Emms RN, BSN [place]  [facility] RN Care Coordinator   Following is a copy of your plan of care:  Care Plan : RN Care Manager Plan of Care  Updates made by Heidi Dach, RN since 10/04/2021 12:00 AM     Problem: Healthcare Management Needs in OB Patient with Pre-eclampsia   Priority: High     Long-Range Goal: Development of  Plan of Care to address needs and knowledge deficits in OB Patient with Pre-eclampsia   Start Date: 10/04/2021  Expected End Date: 12/03/2021  Priority: High  Note:   Current Barriers:  Knowledge Deficits related to plan of care for management of Pre-eclampsia   RNCM Clinical Goal(s):  Patient will verbalize understanding of plan for management of Pre-eclampsia as evidenced by verbalization and self monitoring take all medications exactly as prescribed and will call provider for medication related questions as evidenced by documentation in EMR    attend all scheduled medical appointments: 10/04/21 at 3:50 pm and 10/08/21 at 1:50 pm with OB provider as evidenced by physician documentation in EMR            Interventions: Inter-disciplinary care team collaboration (see longitudinal plan of care) Evaluation of current treatment plan related to  self management and patient's adherence to plan as established by provider Assessed for SDOH needs  Pre-eclampsia :  (Status: New goal.) Long Term Goal  Recommended adequate rest     Reviewed provider prescribed activity limitations    Advised to follow routine screening guidelines as outlined by obstetrics provider    Advised to monitor BP as directed by OB provider Contact OB provider with questions or concerns Decrease salt intake Reviewed Fetal Kick Counts  Patient Goals/Self-Care Activities: Patient will self administer medications as prescribed as evidenced by self report/primary caregiver report  Patient will attend all scheduled provider appointments as evidenced by clinician review of documented attendance to scheduled appointments and patient/caregiver report Patient will continue to perform ADL's independently as evidenced  by patient/caregiver report Patient will continue to perform IADL's independently as evidenced by patient/caregiver report Patient will call provider office for new concerns or questions as evidenced by review of  documented incoming telephone call notes and patient report

## 2021-10-05 LAB — CULTURE, BETA STREP (GROUP B ONLY): Strep Gp B Culture: NEGATIVE

## 2021-10-08 ENCOUNTER — Other Ambulatory Visit: Payer: Self-pay

## 2021-10-08 ENCOUNTER — Encounter: Payer: Self-pay | Admitting: Obstetrics and Gynecology

## 2021-10-08 ENCOUNTER — Ambulatory Visit (INDEPENDENT_AMBULATORY_CARE_PROVIDER_SITE_OTHER): Payer: 59 | Admitting: Obstetrics and Gynecology

## 2021-10-08 VITALS — BP 120/80 | Wt 194.0 lb

## 2021-10-08 DIAGNOSIS — O0990 Supervision of high risk pregnancy, unspecified, unspecified trimester: Secondary | ICD-10-CM

## 2021-10-08 DIAGNOSIS — O09521 Supervision of elderly multigravida, first trimester: Secondary | ICD-10-CM

## 2021-10-08 DIAGNOSIS — O09299 Supervision of pregnancy with other poor reproductive or obstetric history, unspecified trimester: Secondary | ICD-10-CM

## 2021-10-08 DIAGNOSIS — Z3A36 36 weeks gestation of pregnancy: Secondary | ICD-10-CM

## 2021-10-08 DIAGNOSIS — O1493 Unspecified pre-eclampsia, third trimester: Secondary | ICD-10-CM | POA: Diagnosis not present

## 2021-10-08 NOTE — Progress Notes (Signed)
Routine Prenatal Care Visit  Subjective  Lauren Mann is a 45 y.o. [id number] at [redacted]w[redacted]d being seen today for ongoing prenatal care.  She is currently monitored for the following issues for this high-risk pregnancy and has Supervision of high risk pregnancy, antepartum; History of pre-eclampsia in prior pregnancy, currently pregnant; Multigravida of advanced maternal age in first trimester; and Preeclampsia, third trimester on their problem list.  ----------------------------------------------------------------------------------- Patient reports no complaints.   Contractions: Irregular. Vag. Bleeding: None.  Movement: Present. Leaking Fluid denies.  She denies HA, visual changes, and RUQ pain ----------------------------------------------------------------------------------- The following portions of the patient's history were reviewed and updated as appropriate: allergies, current medications, past family history, past medical history, past social history, past surgical history and problem list. Problem list updated.  Objective  Blood pressure 120/80, weight 194 lb (88 kg), last menstrual period 01/28/2021. Pregravid weight 140 lb (63.5 kg) Total Weight Gain 54 lb (24.5 kg) Urinalysis: Urine Protein    Urine Glucose    Fetal Status: Fetal Heart Rate (bpm): 135   Movement: Present     General:  Alert, oriented and cooperative. Patient is in no acute distress.  Skin: Skin is warm and dry. No rash noted.   Cardiovascular: Normal heart rate noted  Respiratory: Normal respiratory effort, no problems with respiration noted  Abdomen: Soft, gravid, appropriate for gestational age. Pain/Pressure: Absent     Pelvic:  Cervical exam deferred        Extremities: Normal range of motion.  Edema: None  Mental Status: Normal mood and affect. Normal behavior. Normal judgment and thought content.   NST: Baseline FHR: 135 beats/min Variability: moderate Accelerations: present Decelerations:  absent Tocometry: not done  Interpretation:  INDICATIONS: preeclampsia without severe features RESULTS:  A NST procedure was performed with FHR monitoring and a normal baseline established, appropriate time of 20-40 minutes of evaluation, and accels >2 seen w 15x15 characteristics.  Results show a REACTIVE NST.    Assessment   45 y.o. [id number] at [redacted]w[redacted]d by  11/04/2021, by Last Menstrual Period presenting for routine prenatal visit  Plan   May 2022 Problems (from 03/19/21 to present)     Problem Noted Resolved   Preeclampsia, third trimester 10/04/2021 by Will Bonnet, MD No   Supervision of high risk pregnancy, antepartum 03/19/2021 by Orlie Pollen, CNM No   Overview Addendum 10/07/2021  9:49 AM by Imagene Riches, CNM     Nursing Staff Provider  Office Location  [facility] Dating   LMP, confirmed [place] 10 wks  Language  English Anatomy US  Complete- echogenic focus  Flu Vaccine   declined Genetic Screen  NIPS:  Normal XY  TDaP vaccine    Hgb A1C or  GTT Early : n/a Third trimester : 114  Rhogam   08/16/21   LAB RESULTS   Feeding Plan  breast Blood Type   B negative  Contraception  Antibody   Negative  Circumcision  Rubella   Immune  Pediatrician   RPR   Non-reactive  Support Person  [name] - husband HBsAg   Negative  Prenatal Classes  HIV   Non-reactive    Varicella   Immune  BTL Consent  n/a GBS  (For PCN allergy, check sensitivities) neg       VBAC Consent  n/a Pap  03/19/21 - NILM/neg    Hgb Electro   n/a    CF  negative     SMA  negative  Preterm labor symptoms and general obstetric precautions including but not limited to vaginal bleeding, contractions, leaking of fluid and fetal movement were reviewed in detail with the patient. Please refer to After Visit Summary for other counseling recommendations.   Return in about 1 week (around 10/15/2021) for ROB, NST.   Thomasene Mohair, MD, [facility], [facility] 10/08/2021  4:07 PM

## 2021-10-14 ENCOUNTER — Inpatient Hospital Stay: Payer: 59 | Admitting: Anesthesiology

## 2021-10-14 ENCOUNTER — Other Ambulatory Visit: Payer: Self-pay

## 2021-10-14 ENCOUNTER — Inpatient Hospital Stay
Admission: EM | Admit: 2021-10-14 | Discharge: 2021-10-16 | DRG: 807 | Disposition: A | Discharge status: Home / Self Care | Payer: 59 | Attending: Obstetrics and Gynecology | Admitting: Obstetrics and Gynecology

## 2021-10-14 ENCOUNTER — Encounter: Payer: Self-pay | Admitting: Obstetrics and Gynecology

## 2021-10-14 DIAGNOSIS — O360130 Maternal care for anti-D [Rh] antibodies, third trimester, not applicable or unspecified: Secondary | ICD-10-CM

## 2021-10-14 DIAGNOSIS — O1414 Severe pre-eclampsia complicating childbirth: Secondary | ICD-10-CM | POA: Diagnosis present

## 2021-10-14 DIAGNOSIS — Z6791 Unspecified blood type, Rh negative: Secondary | ICD-10-CM | POA: Diagnosis not present

## 2021-10-14 DIAGNOSIS — O26893 Other specified pregnancy related conditions, third trimester: Secondary | ICD-10-CM | POA: Diagnosis present

## 2021-10-14 DIAGNOSIS — Z3A37 37 weeks gestation of pregnancy: Secondary | ICD-10-CM

## 2021-10-14 DIAGNOSIS — O4202 Full-term premature rupture of membranes, onset of labor within 24 hours of rupture: Secondary | ICD-10-CM

## 2021-10-14 DIAGNOSIS — O1404 Mild to moderate pre-eclampsia, complicating childbirth: Principal | ICD-10-CM | POA: Diagnosis present

## 2021-10-14 DIAGNOSIS — O09523 Supervision of elderly multigravida, third trimester: Secondary | ICD-10-CM | POA: Diagnosis not present

## 2021-10-14 DIAGNOSIS — Z87891 Personal history of nicotine dependence: Secondary | ICD-10-CM | POA: Diagnosis not present

## 2021-10-14 DIAGNOSIS — O1493 Unspecified pre-eclampsia, third trimester: Secondary | ICD-10-CM | POA: Diagnosis present

## 2021-10-14 DIAGNOSIS — Z20822 Contact with and (suspected) exposure to covid-19: Secondary | ICD-10-CM | POA: Diagnosis present

## 2021-10-14 DIAGNOSIS — O09521 Supervision of elderly multigravida, first trimester: Secondary | ICD-10-CM | POA: Diagnosis present

## 2021-10-14 DIAGNOSIS — O0990 Supervision of high risk pregnancy, unspecified, unspecified trimester: Secondary | ICD-10-CM

## 2021-10-14 DIAGNOSIS — Z7982 Long term (current) use of aspirin: Secondary | ICD-10-CM | POA: Diagnosis not present

## 2021-10-14 LAB — CBC
HCT: 36.1 % (ref 36.0–46.0)
Hemoglobin: 12.4 g/dL (ref 12.0–15.0)
MCH: 32.5 pg (ref 26.0–34.0)
MCHC: 34.3 g/dL (ref 30.0–36.0)
MCV: 94.8 fL (ref 80.0–100.0)
Platelets: 240 10*3/uL (ref 150–400)
RBC: 3.81 MIL/uL — ABNORMAL LOW (ref 3.87–5.11)
RDW: 13.8 % (ref 11.5–15.5)
WBC: 9.2 10*3/uL (ref 4.0–10.5)
nRBC: 0 % (ref 0.0–0.2)

## 2021-10-14 LAB — COMPREHENSIVE METABOLIC PANEL
ALT: 9 U/L (ref 0–44)
AST: 23 U/L (ref 15–41)
Albumin: 3 g/dL — ABNORMAL LOW (ref 3.5–5.0)
Alkaline Phosphatase: 97 U/L (ref 38–126)
Anion gap: 10 (ref 5–15)
BUN: 8 mg/dL (ref 6–20)
CO2: 19 mmol/L — ABNORMAL LOW (ref 22–32)
Calcium: 8.8 mg/dL — ABNORMAL LOW (ref 8.9–10.3)
Chloride: 107 mmol/L (ref 98–111)
Creatinine, Ser: 0.53 mg/dL (ref 0.44–1.00)
GFR, Estimated: >60 mL/min (ref >60)
Glucose, Bld: 91 mg/dL (ref 70–99)
Potassium: 3.8 mmol/L (ref 3.5–5.1)
Sodium: 136 mmol/L (ref 135–145)
Total Bilirubin: 0.6 mg/dL (ref 0.3–1.2)
Total Protein: 7 g/dL (ref 6.5–8.1)

## 2021-10-14 LAB — RESP PANEL BY RT-PCR (FLU A&B, COVID) ARPGX2
Influenza A by PCR: NEGATIVE
Influenza B by PCR: NEGATIVE
SARS Coronavirus 2 by RT PCR: NEGATIVE

## 2021-10-14 LAB — PROTEIN / CREATININE RATIO, URINE
Creatinine, Urine: 33 mg/dL
Protein Creatinine Ratio: 0.27 mg/mg{Cre} — ABNORMAL HIGH (ref 0.00–0.15)
Total Protein, Urine: 9 mg/dL

## 2021-10-14 LAB — TYPE AND SCREEN
ABO/RH(D): B NEG
Antibody Screen: POSITIVE

## 2021-10-14 LAB — RPR: RPR Ser Ql: NONREACTIVE

## 2021-10-14 MED ORDER — DIBUCAINE (PERIANAL) 1 % EX OINT
1.0000 "application " | TOPICAL_OINTMENT | CUTANEOUS | Status: DC | PRN
  Administered 2021-10-14: 1 via RECTAL
  Filled 2021-10-14: qty 28

## 2021-10-14 MED ORDER — BUPIVACAINE HCL (PF) 0.25 % IJ SOLN
INTRAMUSCULAR | Status: DC | PRN
  Administered 2021-10-14: 10 mL via EPIDURAL

## 2021-10-14 MED ORDER — ZOLPIDEM TARTRATE 5 MG PO TABS: 5.0000 mg | ORAL_TABLET | Freq: Every evening | ORAL | Status: DC | PRN

## 2021-10-14 MED ORDER — ONDANSETRON HCL 4 MG/2ML IJ SOLN: 4.0000 mg | Freq: Four times a day (QID) | INTRAMUSCULAR | Status: DC | PRN

## 2021-10-14 MED ORDER — LIDOCAINE HCL (PF) 1 % IJ SOLN
INTRAMUSCULAR | Status: DC | PRN
  Administered 2021-10-14: 1 mL

## 2021-10-14 MED ORDER — DIPHENHYDRAMINE HCL 25 MG PO CAPS: 25.0000 mg | ORAL_CAPSULE | Freq: Four times a day (QID) | ORAL | Status: DC | PRN

## 2021-10-14 MED ORDER — ONDANSETRON HCL 4 MG PO TABS
4.0000 mg | ORAL_TABLET | ORAL | Status: DC | PRN
  Filled 2021-10-14: qty 1

## 2021-10-14 MED ORDER — LABETALOL HCL 5 MG/ML IV SOLN
20.0000 mg | INTRAVENOUS | Status: DC | PRN
  Administered 2021-10-14: 05:00:00 20 mg via INTRAVENOUS

## 2021-10-14 MED ORDER — LIDOCAINE-EPINEPHRINE (PF) 1.5 %-1:200000 IJ SOLN
INTRAMUSCULAR | Status: DC | PRN
  Administered 2021-10-14: 3 mL via PERINEURAL

## 2021-10-14 MED ORDER — LABETALOL HCL 5 MG/ML IV SOLN: 40.0000 mg | INTRAVENOUS | Status: DC | PRN

## 2021-10-14 MED ORDER — DOCUSATE SODIUM 100 MG PO CAPS
100.0000 mg | ORAL_CAPSULE | Freq: Two times a day (BID) | ORAL | Status: DC
  Administered 2021-10-15 – 2021-10-16 (×3): 100 mg via ORAL
  Filled 2021-10-14 (×3): qty 1

## 2021-10-14 MED ORDER — SIMETHICONE 80 MG PO CHEW: 80.0000 mg | CHEWABLE_TABLET | ORAL | Status: DC | PRN

## 2021-10-14 MED ORDER — WITCH HAZEL-GLYCERIN EX PADS
1.0000 "application " | MEDICATED_PAD | CUTANEOUS | Status: DC | PRN
  Administered 2021-10-14: 1 via TOPICAL
  Filled 2021-10-14: qty 100

## 2021-10-14 MED ORDER — OXYCODONE HCL 5 MG PO TABS: 10.0000 mg | ORAL_TABLET | ORAL | Status: DC | PRN

## 2021-10-14 MED ORDER — MAGNESIUM SULFATE BOLUS VIA INFUSION
4.0000 g | Freq: Once | INTRAVENOUS | Status: AC
  Administered 2021-10-14: 06:00:00 4 g via INTRAVENOUS
  Filled 2021-10-14: qty 1000

## 2021-10-14 MED ORDER — COCONUT OIL OIL
1.0000 "application " | TOPICAL_OIL | Status: DC | PRN
  Filled 2021-10-14 (×2): qty 120

## 2021-10-14 MED ORDER — OXYTOCIN BOLUS FROM INFUSION
333.0000 mL | Freq: Once | INTRAVENOUS | Status: AC
  Administered 2021-10-14: 08:00:00 333 mL via INTRAVENOUS

## 2021-10-14 MED ORDER — LABETALOL HCL 5 MG/ML IV SOLN
INTRAVENOUS | Status: AC
  Administered 2021-10-14: 05:00:00 20 mg via INTRAVENOUS
  Filled 2021-10-14: qty 4

## 2021-10-14 MED ORDER — ACETAMINOPHEN 325 MG PO TABS
650.0000 mg | ORAL_TABLET | ORAL | Status: DC | PRN
  Administered 2021-10-14: 17:00:00 650 mg via ORAL
  Filled 2021-10-14: qty 2

## 2021-10-14 MED ORDER — SOD CITRATE-CITRIC ACID 500-334 MG/5ML PO SOLN: 30.0000 mL | ORAL | Status: DC | PRN

## 2021-10-14 MED ORDER — TETANUS-DIPHTH-ACELL PERTUSSIS 5-2.5-18.5 LF-MCG/0.5 IM SUSY: 0.5000 mL | PREFILLED_SYRINGE | Freq: Once | INTRAMUSCULAR | Status: DC

## 2021-10-14 MED ORDER — BENZOCAINE-MENTHOL 20-0.5 % EX AERO
1.0000 "application " | INHALATION_SPRAY | CUTANEOUS | Status: DC | PRN
  Administered 2021-10-14: 1 via TOPICAL
  Filled 2021-10-14: qty 56

## 2021-10-14 MED ORDER — FENTANYL-BUPIVACAINE-NACL 0.5-0.125-0.9 MG/250ML-% EP SOLN
EPIDURAL | Status: DC | PRN
  Administered 2021-10-14: 12 mL/h via EPIDURAL

## 2021-10-14 MED ORDER — OXYCODONE HCL 5 MG PO TABS: 5.0000 mg | ORAL_TABLET | ORAL | Status: DC | PRN

## 2021-10-14 MED ORDER — OXYTOCIN 10 UNIT/ML IJ SOLN: 10.0000 [IU] | Freq: Once | INTRAMUSCULAR | Status: DC

## 2021-10-14 MED ORDER — HYDRALAZINE HCL 20 MG/ML IJ SOLN: 10.0000 mg | INTRAMUSCULAR | Status: DC | PRN

## 2021-10-14 MED ORDER — OXYTOCIN-SODIUM CHLORIDE 30-0.9 UT/500ML-% IV SOLN
2.5000 [IU]/h | INTRAVENOUS | Status: DC
  Administered 2021-10-14: 09:00:00 2.5 [IU]/h via INTRAVENOUS
  Filled 2021-10-14: qty 500

## 2021-10-14 MED ORDER — ONDANSETRON HCL 4 MG/2ML IJ SOLN: 4.0000 mg | INTRAMUSCULAR | Status: DC | PRN

## 2021-10-14 MED ORDER — MAGNESIUM SULFATE 40 GM/1000ML IV SOLN
2.0000 g/h | INTRAVENOUS | Status: DC
  Administered 2021-10-14 – 2021-10-15 (×2): 2 g/h via INTRAVENOUS
  Filled 2021-10-14 (×2): qty 1000

## 2021-10-14 MED ORDER — IBUPROFEN 600 MG PO TABS
600.0000 mg | ORAL_TABLET | Freq: Four times a day (QID) | ORAL | Status: DC
  Administered 2021-10-14 – 2021-10-15 (×3): 600 mg via ORAL
  Filled 2021-10-14 (×5): qty 1

## 2021-10-14 MED ORDER — LACTATED RINGERS IV SOLN: INTRAVENOUS | Status: DC

## 2021-10-14 MED ORDER — PRENATAL MULTIVITAMIN CH
1.0000 | ORAL_TABLET | Freq: Every day | ORAL | Status: DC
  Administered 2021-10-15 – 2021-10-16 (×2): 1 via ORAL
  Filled 2021-10-14 (×2): qty 1

## 2021-10-14 MED ORDER — LACTATED RINGERS IV SOLN: 500.0000 mL | INTRAVENOUS | Status: DC | PRN

## 2021-10-14 MED ORDER — FENTANYL CITRATE (PF) 100 MCG/2ML IJ SOLN
INTRAMUSCULAR | Status: AC
  Administered 2021-10-14: 06:00:00 50 ug
  Filled 2021-10-14: qty 2

## 2021-10-14 MED ORDER — FENTANYL-BUPIVACAINE-NACL 0.5-0.125-0.9 MG/250ML-% EP SOLN
EPIDURAL | Status: AC
  Filled 2021-10-14: qty 250

## 2021-10-14 MED ORDER — CALCIUM GLUCONATE 10 % IV SOLN
INTRAVENOUS | Status: AC
  Filled 2021-10-14: qty 10

## 2021-10-14 MED ORDER — LIDOCAINE HCL (PF) 1 % IJ SOLN
30.0000 mL | INTRAMUSCULAR | Status: DC | PRN
  Filled 2021-10-14: qty 30

## 2021-10-14 MED ORDER — LABETALOL HCL 5 MG/ML IV SOLN: 80.0000 mg | INTRAVENOUS | Status: DC | PRN

## 2021-10-14 NOTE — Anesthesia Procedure Notes (Signed)
Epidural Patient location during procedure: OB Start time: 10/14/2021 5:30 AM  Staffing Anesthesiologist: Gwendolyn Fill, MD Performed: anesthesiologist   Preanesthetic Checklist Completed: patient identified, IV checked, site marked, risks and benefits discussed, surgical consent, monitors and equipment checked, pre-op evaluation and timeout performed  Epidural Patient position: sitting Prep: Betadine Patient monitoring: heart rate, cardiac monitor, continuous pulse ox and blood pressure Approach: midline Location: L3-L4 Injection technique: LOR saline  Needle:  Needle type: Tuohy  Needle gauge: 18 G Needle length: 9 cm Needle insertion depth: 5 cm Catheter size: 20 Guage Catheter at skin depth: 10 cm Test dose: 1.5% lidocaine with Epi 1:200 K and negative  Assessment Sensory level: T10 Events: blood not aspirated, injection not painful and no injection resistance  Additional Notes Patient comfortable upon leaving the room 0/10 pain Reason for block:at surgeon's request and procedure for pain

## 2021-10-14 NOTE — H&P (Signed)
OB History & Physical   History of Present Illness:  Chief Complaint: rupture of membranes  HPI:  Lauren Mann is a 45 y.o. [id number] female at [redacted]w[redacted]d dated by a 10 week ultrasound.  Her pregnancy has been complicated by a history of preeclampsia with severe features, history of preterm labor, Rh negative status, preeclampsia with mild features this pregnancy so far with planned induction of labor on 10/19/21 .    She reports contractions.   She reports leakage of fluid at about 3AM.   She denies vaginal bleeding.   She reports fetal movement.    She reports a gush of fluid at 3AM today with a pink tinge to the fluid. She reports a mild headache, no visual changes, and no RUQ pain.   Total weight gain for pregnancy: 24.5 kg   Obstetrical Problem List: May 2022 Problems (from 03/19/21 to present)     Problem Noted Resolved   [redacted] weeks gestation of pregnancy 10/14/2021 by Conard Novak, MD No   Preeclampsia, third trimester 10/04/2021 by Conard Novak, MD No   Supervision of high risk pregnancy, antepartum 03/19/2021 by Zipporah Plants, CNM No   Overview Addendum 10/07/2021  9:49 AM by Mirna Mires, CNM     Nursing Staff Provider  Office Location  [facility] Dating   LMP, confirmed [place] 10 wks  Language  English Anatomy US  Complete- echogenic focus  Flu Vaccine   declined Genetic Screen  NIPS:  Normal XY  TDaP vaccine    Hgb A1C or  GTT Early : n/a Third trimester : 114  Rhogam   08/16/21   LAB RESULTS   Feeding Plan  breast Blood Type   B negative  Contraception  Antibody   Negative  Circumcision  Rubella   Immune  Pediatrician   RPR   Non-reactive  Support Person  Weston Brass - husband HBsAg   Negative  Prenatal Classes  HIV   Non-reactive    Varicella   Immune  BTL Consent  n/a GBS  (For PCN allergy, check sensitivities) neg       VBAC Consent  n/a Pap  03/19/21 - NILM/neg    Hgb Electro   n/a    CF  negative     SMA  negative                   Maternal Medical  History:   Past Medical History:  Diagnosis Date   History of chicken pox    History of kidney stones    History of UTI    Hyperlipidemia     Past Surgical History:  Procedure Laterality Date   NO PAST SURGERIES      Allergies  Allergen Reactions   Caffeine Palpitations    Prior to Admission medications   Medication Sig Start Date End Date Taking? Authorizing Provider  aspirin EC 81 MG tablet Take 81 mg by mouth daily. Swallow whole.    [provider]  Prenatal Vit-Fe Fumarate-FA (MULTIVITAMIN-PRENATAL) 27-0.8 MG TABS tablet Take 1 tablet by mouth daily at 12 noon.    [provider]    OB History  Gravida Para Term Preterm AB Living  4 2   2 1 2   SAB IAB Ectopic Multiple Live Births  1       2    # Outcome Date GA Lbr Len/2nd Weight Sex Delivery Anes PTL Lv  4 Current  3 SAB 05/24/18    U SAB     2 Preterm 12/02/14 [redacted]w[redacted]d  2353 g M Vag-Spont   LIV     Birth Comments: PREECLAMPSIA-IOL  1 Preterm 07/22/98 [redacted]w[redacted]d  2410 g M Vag-Spont   LIV    Prenatal care site: [facility]  Social History: She  reports that she quit smoking about 12 years ago. Her smoking use included cigarettes. She has never used smokeless tobacco. She reports that she does not currently use alcohol. She reports that she does not currently use drugs after having used the following drugs: Marijuana.  Family History: family history includes Arthritis in her maternal grandmother; Breast cancer in her paternal aunt; Diabetes in her maternal grandmother; Heart disease in her father and paternal grandfather; Hyperlipidemia in her father; Kidney disease in her maternal grandmother; Mental illness in her sister.   Review of Systems  Constitutional: Negative.   HENT: Negative.    Eyes: Negative.   Respiratory: Negative.    Cardiovascular: Negative.   Gastrointestinal: Negative.   Genitourinary: Negative.   Musculoskeletal: Negative.   Skin: Negative.   Neurological:  Negative.   Psychiatric/Behavioral: Negative.      Physical Exam:  BP (!) 168/83   Pulse 100   LMP  (LMP Unknown)   Physical Exam Constitutional:      General: She is not in acute distress.    Appearance: Normal appearance. She is well-developed.  Genitourinary:     Genitourinary Comments: 5 cm per RN  HENT:     Head: Normocephalic and atraumatic.  Eyes:     General: No scleral icterus.    Conjunctiva/sclera: Conjunctivae normal.  Cardiovascular:     Rate and Rhythm: Normal rate and regular rhythm.     Heart sounds: No murmur heard.   No friction rub. No gallop.  Pulmonary:     Effort: Pulmonary effort is normal. No respiratory distress.     Breath sounds: Normal breath sounds. No wheezing or rales.  Abdominal:     General: Bowel sounds are normal. There is no distension.     Palpations: Abdomen is soft. There is no mass.     Tenderness: There is no abdominal tenderness. There is no guarding or rebound.  Musculoskeletal:        General: Normal range of motion.     Cervical back: Normal range of motion and neck supple.  Neurological:     General: No focal deficit present.     Mental Status: She is alert and oriented to person, place, and time.     Cranial Nerves: No cranial nerve deficit.  Skin:    General: Skin is warm and dry.     Findings: No erythema.  Psychiatric:        Mood and Affect: Mood normal.        Behavior: Behavior normal.        Judgment: Judgment normal.    Baseline FHR: 130 beats/min   Variability: moderate   Accelerations: absent   Decelerations: present (early) Contractions: present frequency: 3 q 10 min Overall assessment: cat 2  Bedside Ultrasound:  Number of Fetus: 1  Presentation: cephalic  Fluid: not assessed  Placental Location: posterior  COVID19: pending  Assessment:  Lauren Mann is a 45 y.o. [id number] female at [redacted]w[redacted]d with SROM, preeclampsia with severe features, AMA.   Plan:  Admit to Labor & Delivery  CBC, T&S, Clrs, IVF GBS  negative on 10/01/2021.   Fetwal well-being: reassuring overall Preeclampsia with  severe features: magnesium sulfate, treat BPs as indicated.  Labor: expectant management, augment as indicated.   Thomasene Mohair, MD 10/14/2021 5:12 AM

## 2021-10-14 NOTE — Patient Outreach (Signed)
Care Coordination  10/14/2021  [name] 1976-03-01 [id number]  Lauren Mann is currently admitted as an inpatient at [facility]. The [facility] Managed Care team will follow the progress of [name] and follow up upon discharge.   Estanislado Emms RN, BSN [place]  [employer]

## 2021-10-14 NOTE — Discharge Summary (Signed)
Postpartum Discharge Summary  Date of Service updated: 10/16/2021     Patient Name: Lauren Mann DOB: 10/18/1976 MRN: [id number]  Date of admission: 10/14/2021 Delivery date:10/14/2021  Delivering provider: Imagene Riches  Date of discharge: 10/16/2021  Admitting diagnosis: Normal labor [O80, Z37.9] Intrauterine pregnancy: [redacted]w[redacted]d    Secondary diagnosis:  Active Problems:   Supervision of high risk pregnancy, antepartum   Multigravida of advanced maternal age in first trimester   Preeclampsia, third trimester   Postpartum care following vaginal delivery   Encounter for care or examination of lactating mother  Additional problems: Pre eclampsia    Discharge diagnosis: Term Pregnancy Delivered and Preeclampsia (mild)                                              Post partum procedures:rhogam Augmentation: N/A Complications: None  Hospital course: Onset of Labor With Vaginal Delivery      45y.o. yo [id number] admitted in Latent Labor on 10/14/2021. Patient had a complicated labor course as follows: with some severe blood pressures, she was placed on magnesium sulphate. She had SROMed earlier, and made steady progress wtihout augmentation. Membrane Rupture Time/Date: 3:00 AM ,10/14/2021   Delivery Method:Vaginal, Spontaneous  Episiotomy: None  Lacerations:  1st degree   Patient had a postpartum course marked by magnesium sulphate infusion due to pre eclampsia. She is ambulating, tolerating a regular diet, passing flatus, and urinating well. She reports some left nipple pain with latching and will speak with LC prior to discharge. Her blood pressures have been fluctuating between normo- tensive and mild range elevated since delivery. She denies any symptoms of hyper or hypo tension. Precautions reviewed with patient and she verbalizes understanding of when to hold BP medication and when to notify provider of concerns.  Patient is discharged home in stable condition on  10/16/21.  Newborn Data: Birth date:10/14/2021  Birth time:8:21 AM  Gender:Female  K[place] Living status:Living  Apgars:8 ,9  Weight:3320 g   Magnesium Sulfate received: Yes: Seizure prophylaxis BMZ received: No Rhophylac:Yes MMR:No T-DaP: declined Flu: No Transfusion:No  Physical exam  Vitals:   10/15/21 2142 10/15/21 2351 10/16/21 0359 10/16/21 0823  BP: 125/69 (!) 149/72 (!) 152/86 (!) 146/74  Pulse: (!) 101 91 95 83  Resp: '18  18 18  ' Temp: 98.5 F (36.9 C)  98.5 F (36.9 C) 97.7 F (36.5 C)  TempSrc: Oral  Oral Oral  SpO2: 96%  95% 97%   Patient Vitals for the past 24 hrs:  BP Temp Temp src Pulse Resp SpO2  10/16/21 0823 (!) 146/74 97.7 F (36.5 C) Oral 83 18 97 %  10/16/21 0359 (!) 152/86 98.5 F (36.9 C) Oral 95 18 95 %  10/15/21 2351 (!) 149/72 -- -- 91 -- --  10/15/21 2142 125/69 98.5 F (36.9 C) Oral (!) 101 18 96 %  10/15/21 1941 (!) 141/73 98.2 F (36.8 C) Oral 100 18 95 %  10/15/21 1600 118/71 98 F (36.7 C) Oral 90 20 98 %  10/15/21 1533 121/68 98.4 F (36.9 C) Oral 92 18 99 %  10/15/21 1203 99/62 97.8 F (36.6 C) Oral -- 18 99 %  10/15/21 1028 (!) 141/79 97.7 F (36.5 C) Oral 98 20 100 %  10/15/21 0955 (!) 154/75 -- -- 100 -- --     General: alert,  cooperative, and no distress Lochia: appropriate Uterine Fundus: firm Incision: N/A DVT Evaluation: No evidence of DVT seen on physical exam. Labs: Lab Results  Component Value Date   WBC 10.7 (H) 10/15/2021   HGB 9.5 (L) 10/15/2021   HCT 27.8 (L) 10/15/2021   MCV 95.9 10/15/2021   PLT 224 10/15/2021   CMP Latest Ref Rng & Units 10/14/2021  Glucose 70 - 99 mg/dL 91  BUN 6 - 20 mg/dL 8  Creatinine 0.44 - 1.00 mg/dL 0.53  Sodium 135 - 145 mmol/L 136  Potassium 3.5 - 5.1 mmol/L 3.8  Chloride 98 - 111 mmol/L 107  CO2 22 - 32 mmol/L 19(L)  Calcium 8.9 - 10.3 mg/dL 8.8(L)  Total Protein 6.5 - 8.1 g/dL 7.0  Total Bilirubin 0.3 - 1.2 mg/dL 0.6  Alkaline Phos 38 - 126 U/L 97  AST 15 - 41 U/L 23   ALT 0 - 44 U/L 9   Edinburgh Score: No flowsheet data found.    After visit meds:  Allergies as of 10/16/2021       Reactions   Caffeine Palpitations        Medication List     STOP taking these medications    aspirin EC 81 MG tablet   UNABLE TO FIND       TAKE these medications    labetalol 100 MG tablet Commonly known as: NORMODYNE Take 1 tablet (100 mg total) by mouth 2 (two) times daily.   multivitamin-prenatal 27-0.8 MG Tabs tablet Take 1 tablet by mouth daily at 12 noon.         Discharge home in stable condition Infant Feeding: Breast Infant Disposition:home with mother Discharge instruction: per After Visit Summary and Postpartum booklet. Activity: Advance as tolerated. Pelvic rest for 6 weeks.  Diet: routine diet Anticipated Birth Control: Condoms Postpartum Appointment:1 week Additional Postpartum F/U: BP check 1 week Future Appointments: Future Appointments  Date Time Provider [place]  10/22/2021  3:00 PM [facility] CCC-MM CARE MANAGER [facility] None   Follow up Visit:  Follow-up Information     Imagene Riches, CNM. Go in 5 day(s).   Specialties: Obstetrics, Gynecology Why: post delivery blood pressure check, and make an appointment for a 6 week post delivery exam. Contact information: [address] [place] [phone]                   10/16/2021 Rod Can, CNM

## 2021-10-14 NOTE — Anesthesia Preprocedure Evaluation (Signed)
Anesthesia Evaluation  Patient identified by MRN, date of birth, ID band Patient awake    History of Anesthesia Complications Negative for: history of anesthetic complications  Airway Mallampati: II  TM Distance: >3 FB Neck ROM: Full    Dental no notable dental hx.    Pulmonary former smoker,    Pulmonary exam normal        Cardiovascular hypertension, Pt. on medications Normal cardiovascular exam     Neuro/Psych Anxiety    GI/Hepatic negative GI ROS, Neg liver ROS,   Endo/Other  negative endocrine ROS  Renal/GU negative Renal ROS     Musculoskeletal negative musculoskeletal ROS (+)   Abdominal   Peds  Hematology   Anesthesia Other Findings Mild Pre E, Bp : 130-160s, Cr normal, good UOP, no HA, BV, or RUQ pain  Reproductive/Obstetrics negative OB ROS                             Anesthesia Physical Anesthesia Plan  ASA: 3  Anesthesia Plan: Epidural   Post-op Pain Management: Epidural   Induction:   PONV Risk Score and Plan:   Airway Management Planned:   Additional Equipment:   Intra-op Plan:   Post-operative Plan:   Informed Consent: I have reviewed the patients History and Physical, chart, labs and discussed the procedure including the risks, benefits and alternatives for the proposed anesthesia with the patient or authorized representative who has indicated his/her understanding and acceptance.       Plan Discussed with: Surgeon  Anesthesia Plan Comments:         Anesthesia Quick Evaluation

## 2021-10-15 ENCOUNTER — Ambulatory Visit: Payer: 59

## 2021-10-15 LAB — CBC
HCT: 27.8 % — ABNORMAL LOW (ref 36.0–46.0)
Hemoglobin: 9.5 g/dL — ABNORMAL LOW (ref 12.0–15.0)
MCH: 32.8 pg (ref 26.0–34.0)
MCHC: 34.2 g/dL (ref 30.0–36.0)
MCV: 95.9 fL (ref 80.0–100.0)
Platelets: 224 10*3/uL (ref 150–400)
RBC: 2.9 MIL/uL — ABNORMAL LOW (ref 3.87–5.11)
RDW: 14.4 % (ref 11.5–15.5)
WBC: 10.7 10*3/uL — ABNORMAL HIGH (ref 4.0–10.5)
nRBC: 0 % (ref 0.0–0.2)

## 2021-10-15 LAB — FETAL SCREEN: Fetal Screen: NEGATIVE

## 2021-10-15 LAB — ABO/RH: ABO/RH(D): B NEG

## 2021-10-15 MED ORDER — HYDRALAZINE HCL 20 MG/ML IJ SOLN: 10.0000 mg | INTRAMUSCULAR | Status: DC | PRN

## 2021-10-15 MED ORDER — LABETALOL HCL 5 MG/ML IV SOLN: 20.0000 mg | INTRAVENOUS | Status: DC | PRN

## 2021-10-15 MED ORDER — LABETALOL HCL 100 MG PO TABS
ORAL_TABLET | ORAL | Status: AC
  Filled 2021-10-15: qty 1

## 2021-10-15 MED ORDER — RHO D IMMUNE GLOBULIN 1500 UNIT/2ML IJ SOSY
300.0000 ug | PREFILLED_SYRINGE | Freq: Once | INTRAMUSCULAR | Status: AC
  Administered 2021-10-15: 300 ug via INTRAVENOUS
  Filled 2021-10-15: qty 2

## 2021-10-15 MED ORDER — LABETALOL HCL 100 MG PO TABS
100.0000 mg | ORAL_TABLET | Freq: Two times a day (BID) | ORAL | Status: DC
  Administered 2021-10-16: 100 mg via ORAL
  Filled 2021-10-15: qty 1

## 2021-10-15 MED ORDER — LABETALOL HCL 5 MG/ML IV SOLN: 80.0000 mg | INTRAVENOUS | Status: DC | PRN

## 2021-10-15 MED ORDER — LABETALOL HCL 100 MG PO TABS
ORAL_TABLET | ORAL | Status: AC
  Filled 2021-10-15: qty 2

## 2021-10-15 MED ORDER — LABETALOL HCL 200 MG PO TABS
200.0000 mg | ORAL_TABLET | Freq: Two times a day (BID) | ORAL | Status: DC
  Administered 2021-10-15: 200 mg via ORAL

## 2021-10-15 MED ORDER — LABETALOL HCL 5 MG/ML IV SOLN: 40.0000 mg | INTRAVENOUS | Status: DC | PRN

## 2021-10-15 NOTE — Lactation Note (Signed)
This note was copied from a baby's chart. Lactation Consultation Note  Patient Name: Lauren Mann VOJJK'K Date: 10/15/2021 Reason for consult: RN request;Early term 37-38.6wks;Other (Comment) (post circumcision) Age:43 hours  Lactation visit per RN request. Mom is P3, SVD 29hrs ago, new to [facility] after 24hrs of Mag2+ post delivery. Mom has some breastfeeding hx, but reports difficult latching and development of thrush. Baby initially was feeding well, no concerns voiced or observed by nursing staff providing care. Baby has been more tired today, sleeping for longer periods and was just recently circumcised.  Post circumcision check RN asked for Lactation presence for attempted feeding. Baby was unswaddled, placed in cradle hold at L breast asleep. LC stimulated baby, adjusted position and added support pillows. At first look mom's nipples appear flat, but she has compressible tissue that everts with stimulation/hand expression. Baby uninterested in feedings, did not attempt to latch/open his mouth/no sucking elicited.  We discussed varying feeding behaviors, positive output for HOL, and impact that circumcision can have on his desire to feed.  Encouraged skin to skin for the afternoon, frequent attempts, and options for hand expression/spoon feeding. LC and RN assisted with getting baby skin to skin on mom's chest. LC number pointed out and parents encouraged to call with all attempts for support.  Maternal Data Has patient been taught Hand Expression?: Yes Does the patient have breastfeeding experience prior to this delivery?: Yes How long did the patient breastfeed?: short term  Feeding Mother's Current Feeding Choice: Breast Milk  LATCH Score Latch: Too sleepy or reluctant, no latch achieved, no sucking elicited.  Audible Swallowing: None  Type of Nipple: Everted at rest and after stimulation (everted after stimulation; compressible tissue)  Comfort (Breast/Nipple): Soft /  non-tender  Hold (Positioning): Assistance needed to correctly position infant at breast and maintain latch.  LATCH Score: 5   Lactation Tools Discussed/Used    Interventions Interventions: Assisted with latch;Adjust position;Support pillows;Education  Discharge    Consult Status Consult Status: Follow-up Date: 10/15/21 Follow-up type: In-patient    Danford Bad 10/15/2021, 1:51 PM

## 2021-10-15 NOTE — Progress Notes (Signed)
Magnesium Check Note  10/15/2021 - 9:01 AM  45 y.o. [id number].  Pregnancy complicated by preeclampsia with severe features.  Patient Active Problem List   Diagnosis Date Noted   Normal labor 10/14/2021   [redacted] weeks gestation of pregnancy 10/14/2021   Postpartum care following vaginal delivery 10/14/2021   Preeclampsia, third trimester 10/04/2021   Multigravida of advanced maternal age in first trimester 04/12/2021   Supervision of high risk pregnancy, antepartum 03/19/2021   History of pre-eclampsia in prior pregnancy, currently pregnant 03/19/2021      Subjective:  Patient admits to headache since last night improved with Ibuprofen, denies visual disturbances, denies RUQ pain, denies chest pain, denies shortness of breath. She reports breastfeeding has been going well. She is tolerating regular diet, her pain is controlled with PO medications, she is not yet ambulating. Foley catheter is to be discontinued this AM.   Objective:   Vitals:   10/15/21 0530 10/15/21 0630 10/15/21 0726 10/15/21 0850  BP: 132/72 131/70 (!) 142/77 133/73  Pulse: (!) 105 97 93 99  Resp:  16 20 20   Temp:   98 F (36.7 C)   TempSrc:      SpO2: 96% 96% 98% 98%    Current Vital Signs 24h Vital Sign Ranges  T 98 F (36.7 C) Temp  Avg: 98 F (36.7 C)  Min: 98 F (36.7 C)  Max: 98.1 F (36.7 C)  BP 133/73 BP  Min: 117/73  Max: 148/78  HR 99 Pulse  Avg: 99.5  Min: 91  Max: 108  RR 20 Resp  Avg: 17.5  Min: 15  Max: 20  SaO2 98 % Room Air SpO2  Avg: 96.3 %  Min: 95 %  Max: 99 %       24 Hour I/O Current Shift I/O  Time Ins Outs 11/28 0701 - 11/29 0700 In: 3246.3 [P.O.:2125; I.V.:1121.3] Out: 4560 [Urine:4285] 11/29 0701 - 11/29 1900 In: -  Out: 500 [Urine:500]   Patient Vitals for the past 24 hrs:  BP Temp Temp src Pulse Resp SpO2  10/15/21 0850 133/73 -- -- 99 20 98 %  10/15/21 0726 (!) 142/77 98 F (36.7 C) -- 93 20 98 %  10/15/21 0630 131/70 -- -- 97 16 96 %  10/15/21 0530 132/72 -- -- (!) 105  -- 96 %  10/15/21 0430 (!) 148/78 -- -- 97 -- 96 %  10/15/21 0355 -- -- -- -- -- 97 %  10/15/21 0351 -- -- -- -- -- 95 %  10/15/21 0346 -- -- -- -- -- 96 %  10/15/21 0341 -- -- -- -- -- 96 %  10/15/21 0336 -- -- -- -- -- 96 %  10/15/21 0331 117/73 -- -- (!) 101 16 96 %  10/15/21 0326 -- -- -- -- -- 96 %  10/15/21 0321 -- -- -- -- -- 96 %  10/15/21 0316 -- -- -- -- -- 96 %  10/15/21 0311 -- -- -- -- -- 96 %  10/15/21 0306 -- -- -- -- -- 96 %  10/15/21 0301 -- -- -- -- -- 96 %  10/15/21 0256 -- -- -- -- -- 97 %  10/15/21 0251 -- -- -- -- -- 96 %  10/15/21 0246 -- -- -- -- -- 96 %  10/15/21 0241 -- -- -- -- -- 96 %  10/15/21 0236 -- -- -- -- -- 96 %  10/15/21 0231 140/76 -- -- 98 -- 96 %  10/15/21 0226 -- -- -- -- -- 96 %  10/15/21 0221 -- -- -- -- -- 96 %  10/15/21 0216 -- -- -- -- -- 96 %  10/15/21 0211 -- -- -- -- -- 97 %  10/15/21 0206 -- -- -- -- -- 96 %  10/15/21 0201 -- -- -- -- -- 96 %  10/15/21 0130 (!) 141/77 -- -- 98 -- 97 %  10/15/21 0031 (!) 141/78 98.1 F (36.7 C) Oral (!) 102 16 97 %  10/14/21 2330 132/68 -- -- (!) 104 -- 96 %  10/14/21 2230 (!) 142/83 -- -- 99 -- 95 %  10/14/21 2130 140/76 -- -- (!) 108 18 97 %  10/14/21 2030 130/72 -- -- (!) 102 15 96 %  10/14/21 1935 118/68 98 F (36.7 C) Oral (!) 104 17 --  10/14/21 1730 -- -- -- -- -- 97 %  10/14/21 1532 135/79 -- -- (!) 106 16 --  10/14/21 1531 -- -- -- -- -- 97 %  10/14/21 1436 -- -- -- -- -- 95 %  10/14/21 1433 (!) 144/69 -- -- (!) 106 18 --  10/14/21 1353 140/84 -- -- (!) 102 18 --  10/14/21 1330 -- -- -- -- -- 97 %  10/14/21 1227 (!) 144/83 -- -- 98 18 --  10/14/21 1131 -- -- -- -- -- 99 %  10/14/21 1129 (!) 143/79 -- -- 98 18 --  10/14/21 1045 (!) 144/81 -- -- 95 -- --  10/14/21 1030 132/70 -- -- 92 18 --  10/14/21 0945 (!) 127/91 -- -- 98 18 --  10/14/21 0930 134/73 -- -- 94 -- --  10/14/21 0915 134/71 -- -- 91 -- --     Physical Exam: General: NAD Pulm: CTAB CV: RRR Abd: Soft,  nontender, nondistended, +BS Ext: SCDs in place Neuro: DTRs 2+ brachioradialis  Labs:  Recent Labs  Lab 10/14/21 0428 10/15/21 0515  WBC 9.2 10.7*  HGB 12.4 9.5*  HCT 36.1 27.8*  PLT 240 224   Recent Labs  Lab 10/14/21 0428  NA 136  K 3.8  CL 107  CO2 19*  BUN 8  CREATININE 0.53  CALCIUM 8.8*  PROT 7.0  BILITOT 0.6  ALKPHOS 97  ALT 9  AST 23  GLUCOSE 91    Medications SCHEDULED MEDICATIONS   docusate sodium  100 mg Oral BID   ibuprofen  600 mg Oral Q6H   oxytocin  10 Units Intramuscular Once   prenatal multivitamin  1 tablet Oral Q1200   Tdap  0.5 mL Intramuscular Once    MEDICATION INFUSIONS   lactated ringers 999 mL/hr at 10/14/21 0521   lactated ringers 50 mL/hr at 10/15/21 0330   magnesium sulfate Stopped (10/15/21 0830)   oxytocin 2.5 Units/hr (10/14/21 0900)    PRN MEDICATIONS  acetaminophen, benzocaine-Menthol, coconut oil, witch hazel-glycerin **AND** dibucaine, diphenhydrAMINE, labetalol **AND** labetalol **AND** labetalol **AND** hydrALAZINE **AND** Measure blood pressure, lactated ringers, lidocaine (PF), ondansetron, ondansetron **OR** ondansetron (ZOFRAN) IV, oxyCODONE, oxyCODONE, simethicone, sodium citrate-citric acid, zolpidem   Assessment & Plan:  45 y.o. [id number] without signs of magnesium toxicity 1) preeclampsia with severe feature, postpartum: 24 hours magnesium complete, start Labetalol 200 mg BID 2) transfer to mother baby 3) continue routine postpartum care   [name], CNM 10/15/2021 9:01 AM  [facility]

## 2021-10-15 NOTE — Anesthesia Postprocedure Evaluation (Signed)
Anesthesia Post Note  Patient: Lauren Mann  Procedure(s) Performed: AN AD HOC LABOR EPIDURAL  Patient location during evaluation: L&D Anesthesia Type: Epidural Level of consciousness: awake and alert and sedated Pain management: pain level controlled Vital Signs Assessment: post-procedure vital signs reviewed and stable Respiratory status: respiratory function stable Cardiovascular status: stable Postop Assessment: no headache, no backache, patient able to bend at knees, no apparent nausea or vomiting and adequate PO intake Anesthetic complications: no Comments: Pt not ambulating yet as she is on a Magnesium gtt   No notable events documented.   Last Vitals:  Vitals:   10/15/21 0630 10/15/21 0726  BP: 131/70 (!) 142/77  Pulse: 97 93  Resp: 16 20  Temp:  36.7 C  SpO2: 96% 98%    Last Pain:  Vitals:   10/15/21 0630  TempSrc:   PainSc: 0-No pain                 Zachary George

## 2021-10-16 LAB — RHOGAM INJECTION: Unit division: 0

## 2021-10-16 MED ORDER — LABETALOL HCL 100 MG PO TABS: 100.0000 mg | ORAL_TABLET | Freq: Two times a day (BID) | ORAL | 1 refills | Status: DC

## 2021-10-16 MED ORDER — LABETALOL HCL 100 MG PO TABS: 100.0000 mg | ORAL_TABLET | Freq: Every day | ORAL | Status: DC

## 2021-10-16 MED ORDER — LABETALOL HCL 100 MG PO TABS
100.0000 mg | ORAL_TABLET | Freq: Every day | ORAL | Status: DC
  Administered 2021-10-16: 100 mg via ORAL
  Filled 2021-10-16: qty 1

## 2021-10-16 NOTE — Lactation Note (Signed)
This note was copied from a baby's chart. Lactation Consultation Note  Patient Name: Lauren Mann [id number] Date: 10/16/2021 Reason for consult: Follow-up assessment;Early term 37-38.6wks Age:43 hours  Lactation follow-up prior to anticipated discharge. Baby's feedings did improve overnight, sustaining latch for longer periods. Mom notes painful latch and discomfort- LC observes flat/inverted nipples; stimulation does bring them out somewhat, however mom's breasts are filling and tissue is tight.  With attempted feeding a nipple shield (size 20) was implemented, baby did accept the shield with on/off sucking pattern but nothing consistent and likely nothing transferred. Provided mom with guidance on ongoing management of breast fullness and engorgement- tips for relief and maintaining relief. Guidance given for achieving a good position on both breasts: baby turned in, cheeks/chin/nose all touching.  Guidance given for outpatient lactation services and community support available. Encouraged her to call for support as needed.  Maternal Data Has patient been taught Hand Expression?: Yes Does the patient have breastfeeding experience prior to this delivery?: Yes How long did the patient breastfeed?: short period  Feeding Mother's Current Feeding Choice: Breast Milk  LATCH Score Latch: Repeated attempts needed to sustain latch, nipple held in mouth throughout feeding, stimulation needed to elicit sucking reflex. (baby was sleepy)  Audible Swallowing: None  Type of Nipple: Flat  Comfort (Breast/Nipple): Filling, red/small blisters or bruises, mild/mod discomfort  Hold (Positioning): Assistance needed to correctly position infant at breast and maintain latch.  LATCH Score: 4   Lactation Tools Discussed/Used Tools: Nipple Shields Nipple shield size: 20  Interventions Interventions: Breast feeding basics reviewed;Assisted with latch;Hand express;Adjust position;Support  pillows;Position options;Education;DEBP (nipple shield)  Discharge Discharge Education: Engorgement and breast care;Warning signs for feeding baby;Outpatient recommendation Pump: Personal  Consult Status Consult Status: Complete    Danford Bad 10/16/2021, 11:43 AM

## 2021-10-16 NOTE — Discharge Instructions (Addendum)
Discharge instructions:   Call office if you have any of the following:  headache, visual changes, fever >101.0 F, chills, breast concerns (engorgement, mastitis) excessive vaginal bleeding, incision drainage or problems, leg pain or redness, depression or any other concerns.   Activity: Do not lift > 10 lbs for 6 weeks.  No intercourse or tampons for 6 weeks.  No driving for 1-2 weeks or while taking pain medication. No strenuous activity or heavy lifting for 6 weeks.  No swimming pools, hot tubs or tub baths- showers only.    It is normal to bleed for up to 6 weeks. You should not soak through more than 1 pad in 1 hour.   Continue prenatal vitamin. Increase calories and fluids while breastfeeding.  Your milk will come in, in the next couple of days (right now it is colostrum).  You may have a slight fever when your milk comes in, but it should go away on its own.   If it does not, and rises above 101 F please call the doctor.  You will also feel achy and your breasts will be firm. They will also start to leak.  If you are breastfeeding, continue as you have been and you can pump/express milk for comfort.   For concerns about your baby, please call your pediatrician For breastfeeding concerns, the lactation consultant can be reached at [phone]  Postpartum blues (feelings of happy one minute and sad another minute) are normal for the first few weeks but if it gets worse let your doctor know.  hype

## 2021-10-17 ENCOUNTER — Telehealth: Payer: Self-pay

## 2021-10-17 DIAGNOSIS — Z419 Encounter for procedure for purposes other than remedying health state, unspecified: Secondary | ICD-10-CM | POA: Diagnosis not present

## 2021-10-17 NOTE — Telephone Encounter (Signed)
Transition Care Management Follow-up Telephone Call Date of discharge and from where: 10/16/2021-[facility] How have you been since you were released from the hospital? Patient stated she is doing fine.  Any questions or concerns? No  Items Reviewed: Did the pt receive and understand the discharge instructions provided? Yes  Medications obtained and verified? Yes  Other? No  Any new allergies since your discharge? No  Dietary orders reviewed? No Do you have support at home? Yes   Home Care and Equipment/Supplies: Were home health services ordered? not applicable If so, what is the name of the agency? N/A  Has the agency set up a time to come to the patient's home? not applicable Were any new equipment or medical supplies ordered?  No What is the name of the medical supply agency? N/A Were you able to get the supplies/equipment? not applicable Do you have any questions related to the use of the equipment or supplies? No  Functional Questionnaire: (I = Independent and D = Dependent) ADLs: I  Bathing/Dressing- I  Meal Prep- I  Eating- I  Maintaining continence- I  Transferring/Ambulation- I  Managing Meds- I  Follow up appointments reviewed:  PCP Hospital f/u appt confirmed? No   Specialist Hospital f/u appt confirmed? Yes  Scheduled to see OBGNY on 10/21/2021 @ 11:30AM. Are transportation arrangements needed? No  If their condition worsens, is the pt aware to call PCP or go to the Emergency Dept.? Yes Was the patient provided with contact information for the PCP's office or ED? Yes Was to pt encouraged to call back with questions or concerns? Yes

## 2021-10-21 ENCOUNTER — Other Ambulatory Visit: Payer: Self-pay

## 2021-10-21 ENCOUNTER — Ambulatory Visit (INDEPENDENT_AMBULATORY_CARE_PROVIDER_SITE_OTHER): Payer: 59 | Admitting: Obstetrics and Gynecology

## 2021-10-21 ENCOUNTER — Encounter: Payer: Self-pay | Admitting: Obstetrics and Gynecology

## 2021-10-21 VITALS — BP 148/82 | Ht 65.0 in | Wt 175.0 lb

## 2021-10-21 DIAGNOSIS — Z0130 Encounter for examination of blood pressure without abnormal findings: Secondary | ICD-10-CM

## 2021-10-21 NOTE — Progress Notes (Signed)
Obstetrics & Gynecology Office Visit   Chief Complaint:  Chief Complaint  Patient presents with   Post-op Follow-up    1 wk postpartum - no concerns. RM 5    History of Present Illness: 45 y.o. [id number] being seen for follow up blood pressure check today.  The patient is postpartum.  The established diagnosis for the patient is preeclampsia with severe features.  She is currently on labetalol 100mg .  She reports no current symptoms attributable to her blood pressure.  Medication list reviewed medications which may contribute to BP elevation were not noted and no medications contraindicated for use in patient with current hypertension were noted.  Review of Systems: Review of Systems  Constitutional: Negative.   Cardiovascular:  Negative for chest pain and palpitations.  Gastrointestinal:  Negative for abdominal pain, nausea and vomiting.  Neurological:  Negative for headaches.  Psychiatric/Behavioral: Negative.      Past Medical History:  Past Medical History:  Diagnosis Date   History of chicken pox    History of kidney stones    History of UTI    Hyperlipidemia     Past Surgical History:  Past Surgical History:  Procedure Laterality Date   NO PAST SURGERIES      Gynecologic History: No LMP recorded (lmp unknown).  Obstetric History:  Family History:  Family History  Problem Relation Age of Onset   Hyperlipidemia Father    Heart disease Father    Mental illness Sister    Breast cancer Paternal Aunt    Arthritis Maternal Grandmother    Kidney disease Maternal Grandmother    Diabetes Maternal Grandmother    Heart disease Paternal Grandfather     Social History:  Social History   Socioeconomic History   Marital status: Married    Spouse name: [id number]   Number of children: 2   Years of education: Not on file   Highest education level: Not on file  Occupational History   Not on file  Tobacco Use   Smoking status: Former    Types: Cigarettes     Quit date: 12/09/2008    Years since quitting: 12.8   Smokeless tobacco: Never  Vaping Use   Vaping Use: Never used  Substance and Sexual Activity   Alcohol use: Not Currently    Alcohol/week: 0.0 - 1.0 standard drinks    Comment: very rare   Drug use: Not Currently    Types: Marijuana    Comment: when she was younger   Sexual activity: Yes    Birth control/protection: None  Other Topics Concern   Not on file  Social History Narrative   Not on file   Social Determinants of Health   Financial Resource Strain: Not on file  Food Insecurity: No Food Insecurity   Worried About 12/11/2008 in the Last Year: Never true   Ran Out of Food in the Last Year: Never true  Transportation Needs: No Transportation Needs   Lack of Transportation (Medical): No   Lack of Transportation (Non-Medical): No  Physical Activity: Not on file  Stress: Not on file  Social Connections: Not on file  Intimate Partner Violence: Not on file    Allergies:  Allergies  Allergen Reactions   Caffeine Palpitations    Medications: Prior to Admission medications   Medication Sig Start Date End Date Taking? Authorizing Provider  labetalol (NORMODYNE) 100 MG tablet Take 1 tablet (100 mg total) by mouth 2 (two) times daily. 10/16/21  [name], CNM  Prenatal Vit-Fe Fumarate-FA (MULTIVITAMIN-PRENATAL) 27-0.8 MG TABS tablet Take 1 tablet by mouth daily at 12 noon.    [provider]    Physical Exam Blood pressure (!) 148/82, height 5\' 5"  (1.651 m), weight 175 lb (79.4 kg), currently breastfeeding.  No LMP recorded (lmp unknown).  General: NAD HEENT: normocephalic, anicteric Pulmonary: No increased work of breathing Neurologic: Grossly intact Psychiatric: mood appropriate, affect full   Edinburgh Postnatal Depression Scale - 10/21/21 1200       Edinburgh Postnatal Depression Scale:  In the Past 7 Days   I have been able to laugh and see the funny side of things. 0    I have  looked forward with enjoyment to things. 0    I have blamed myself unnecessarily when things went wrong. 1    I have been anxious or worried for no good reason. 0    I have felt scared or panicky for no good reason. 0    Things have been getting on top of me. 0    I have been so unhappy that I have had difficulty sleeping. 1    I have felt sad or miserable. 1    I have been so unhappy that I have been crying. 0    The thought of harming myself has occurred to me. 0    Edinburgh Postnatal Depression Scale Total 3             Assessment: 45 y.o. 57 presenting for blood pressure evaluation today  Plan: Problem List Items Addressed This Visit   None Visit Diagnoses     BP check    -  Primary       1) Blood pressure - blood pressure at today's visit is elevated.  As a result continue current regimen of labetalol 100mg  po bid.  We discussed going to 200mg  po BID but patient is asymptomatic.  She does have a means of checking BP at home.  Discussed discontinuing once BP runs consistently <120 systolic and <90 diastolic - additional blood work was not obtained  2) Return in about 5 weeks (around 11/25/2021) for 6 week post partum .    , MD, 01/23/2022 OB/GYN, [facility] 10/21/2021, 11:52 AM

## 2021-10-22 ENCOUNTER — Other Ambulatory Visit: Payer: 59

## 2021-10-22 ENCOUNTER — Other Ambulatory Visit: Payer: Self-pay | Admitting: *Deleted

## 2021-10-22 ENCOUNTER — Encounter: Payer: Self-pay | Admitting: Advanced Practice Midwife

## 2021-10-22 NOTE — Patient Outreach (Signed)
Medicaid Managed Care   Nurse Care Manager Note  10/22/2021 Name:  Lauren Mann MRN:  [id number] DOB:  10-28-1976  Lauren Mann is an 45 y.o. year old female who is a primary patient of Lauren Sams, DO.  [facility] Man[facility] Care Coordination team was consulted for assistance with:    Obstetrics healthcare management needs  Ms. Lauren Mann was given information about Medicaid Managed Care Coordination team services today. Lauren Mann Patient agreed to services and verbal consent obtained.  Engaged with patient by telephone for follow up visit in response to provider referral for case management and/or care coordination services.   Assessments/Interventions:  Review of past medical history, allergies, medications, health status, including review of consultants reports, laboratory and other test data, was performed as part of comprehensive evaluation and provision of chronic care management services.  SDOH (Social Determinants of Health) assessments and interventions performed:   Care Plan  Allergies  Allergen Reactions   Caffeine Palpitations    Medications Reviewed Today     Reviewed by Heidi Dach, RN (Registered Nurse) on 10/22/21 at 1459  Med List Status: <None>   Medication Order Taking? Sig Documenting Provider Last Dose Status Informant  labetalol (NORMODYNE) 100 MG tablet [id number] Yes Take 1 tablet (100 mg total) by mouth 2 (two) times daily. [name], CNM Taking Active   Prenatal Vit-Fe Fumarate-FA (MULTIVITAMIN-PRENATAL) 27-0.8 MG TABS tablet [id number] Yes Take 1 tablet by mouth daily at 12 noon. [provider] Taking Active Self            Patient Active Problem List   Diagnosis Date Noted   Encounter for care or examination of lactating mother 10/16/2021   Postpartum care following vaginal delivery 10/14/2021   Preeclampsia, third trimester 10/04/2021   Multigravida of advanced maternal age in first trimester 04/12/2021   Supervision of high  risk pregnancy, antepartum 03/19/2021   History of pre-eclampsia in prior pregnancy, currently pregnant 03/19/2021    Conditions to be addressed/monitored per PCP order:   Obstetric health management needs  Care Plan : RN Care Manager Plan of Care  Updates made by Heidi Dach, RN since 10/22/2021 12:00 AM     Problem: Healthcare Management Needs in OB Patient with Pre-eclampsia   Priority: High     Long-Range Goal: Development of Plan of Care to address needs and knowledge deficits in OB Patient with Pre-eclampsia   Start Date: 10/04/2021  Expected End Date: 12/03/2021  Priority: High  Note:   Current Barriers:  Knowledge Deficits related to plan of care for management of Pre-eclampsia -Lauren Mann delivered 10/14/21. She was discharged home with labetalol to take twice daily.  RNCM Clinical Goal(s):  Patient will verbalize understanding of plan for management of Pre-eclampsia as evidenced by verbalization and self monitoring take all medications exactly as prescribed and will call provider for medication related questions as evidenced by documentation in EMR    attend all scheduled medical appointments: 11/28/21 @ 1:15 for postpartum visit as evidenced by physician documentation in EMR            Interventions: Inter-disciplinary care team collaboration (see longitudinal plan of care) Evaluation of current treatment plan related to  self management and patient's adherence to plan as established by provider Assessed for SDOH needs Advised patient to contact pediatrician with concerns or questions related to newborn  Pre-eclampsia :  (Status: Goal on Track (progressing): YES.) Long Term Goal  Recommended adequate rest   Provided education on  preeclampsia   Advised to monitor BP as directed by OB provider Contact OB provider with questions or concerns Decrease salt intake Advised to take labetalol as directed  Patient Goals/Self-Care Activities: Patient will self administer  medications as prescribed as evidenced by self report/primary caregiver report  Patient will attend all scheduled provider appointments as evidenced by clinician review of documented attendance to scheduled appointments and patient/caregiver report Patient will continue to perform ADL's independently as evidenced by patient/caregiver report Patient will continue to perform IADL's independently as evidenced by patient/caregiver report Patient will call provider office for new concerns or questions as evidenced by review of documented incoming telephone call notes and patient report       Follow Up:  Patient agrees to Care Plan and Follow-up.  Plan: The Managed Medicaid care management team will reach out to the patient again over the next 30 days.  Date/time of next scheduled RN care management/care coordination outreach:  11/29/20 @ 10:30am  Estanislado Emms RN, BSN [place]  [facility] Care Coordinator

## 2021-10-22 NOTE — Patient Instructions (Signed)
Visit Information  Lauren Mann was given information about Medicaid Managed Care team care coordination services as a part of their [facility] Medicaid benefit. Lauren Mann verbally consented to engagement with the [facility] Managed Care team.   If you are experiencing a medical emergency, please call 911 or report to your local emergency department or urgent care.   If you have a non-emergency medical problem during routine business hours, please contact your provider's office and ask to speak with a nurse.   For questions related to your [facility] health plan, please call: [phone] or go here:[email or web address]  If you would like to schedule transportation through your [facility] plan, please call the following number at least 2 days in advance of your appointment: [phone].  Call the [facility] Crisis Line at [phone], at any time, 24 hours a day, 7 days a week. If you are in danger or need immediate medical attention call 911.  If you would like help to quit smoking, call 1-800-QUIT-NOW (972 844 9954) OR Espaol: 1-855-Djelo-Ya (7-048-889-1694) o para ms informacin haga clic aqu or Text READY to 503-888 to register via text  Lauren Mann - following are the goals we discussed in your visit today:   Goals Addressed   None     Please see education materials related to postpartum HTN and breastfeeding provided by MyChart link.  Patient has access to MyChart and can view provided education  Telephone follow up appointment with Managed Medicaid care management team member scheduled for:11/29/21 @ 10:30am  Estanislado Emms RN, BSN [place]  [facility] RN Care Coordinator   Following is a copy of your plan of care:  Care Plan : RN Care Manager Plan of Care  Updates made by Heidi Dach, RN since 10/22/2021 12:00 AM     Problem: Healthcare Management Needs in OB Patient with Pre-eclampsia   Priority: High     Long-Range  Goal: Development of Plan of Care to address needs and knowledge deficits in OB Patient with Pre-eclampsia   Start Date: 10/04/2021  Expected End Date: 12/03/2021  Priority: High  Note:   Current Barriers:  Knowledge Deficits related to plan of care for management of Pre-eclampsia -Lauren Mann delivered 10/14/21. She was discharged home with labetalol to take twice daily.  RNCM Clinical Goal(s):  Patient will verbalize understanding of plan for management of Pre-eclampsia as evidenced by verbalization and self monitoring take all medications exactly as prescribed and will call provider for medication related questions as evidenced by documentation in EMR    attend all scheduled medical appointments: 11/28/21 @ 1:15 for postpartum visit as evidenced by physician documentation in EMR            Interventions: Inter-disciplinary care team collaboration (see longitudinal plan of care) Evaluation of current treatment plan related to  self management and patient's adherence to plan as established by provider Assessed for SDOH needs Advised patient to contact pediatrician with concerns or questions related to newborn  Pre-eclampsia :  (Status: Goal on Track (progressing): YES.) Long Term Goal  Recommended adequate rest   Provided education on preeclampsia   Advised to monitor BP as directed by OB provider Contact OB provider with questions or concerns Decrease salt intake Advised to take labetalol as directed  Patient Goals/Self-Care Activities: Patient will self administer medications as prescribed as evidenced by self report/primary caregiver report  Patient will attend all scheduled provider appointments as evidenced by clinician review of documented attendance to scheduled appointments and patient/caregiver report  Patient will continue to perform ADL's independently as evidenced by patient/caregiver report Patient will continue to perform IADL's independently as evidenced by patient/caregiver  report Patient will call provider office for new concerns or questions as evidenced by review of documented incoming telephone call notes and patient report

## 2021-10-23 ENCOUNTER — Ambulatory Visit: Payer: 59 | Admitting: Licensed Practical Nurse

## 2021-10-23 NOTE — Telephone Encounter (Signed)
Pt scheduled with lydia at 2:10 today

## 2021-10-24 ENCOUNTER — Ambulatory Visit (INDEPENDENT_AMBULATORY_CARE_PROVIDER_SITE_OTHER): Payer: 59 | Admitting: Licensed Practical Nurse

## 2021-10-24 ENCOUNTER — Other Ambulatory Visit: Payer: Self-pay

## 2021-10-24 ENCOUNTER — Ambulatory Visit: Payer: 59 | Admitting: Licensed Practical Nurse

## 2021-10-24 VITALS — BP 120/80 | Temp 97.8°F | Ht 65.0 in | Wt 170.0 lb

## 2021-10-24 DIAGNOSIS — O9122 Nonpurulent mastitis associated with the puerperium: Secondary | ICD-10-CM | POA: Diagnosis not present

## 2021-10-24 DIAGNOSIS — N61 Mastitis without abscess: Secondary | ICD-10-CM

## 2021-10-24 MED ORDER — NYSTATIN 100000 UNIT/GM EX CREA: TOPICAL_CREAM | Freq: Three times a day (TID) | CUTANEOUS | Status: DC

## 2021-10-24 MED ORDER — NYSTATIN 100000 UNIT/GM EX CREA: 1.0000 "application " | TOPICAL_CREAM | Freq: Two times a day (BID) | CUTANEOUS | 0 refills | Status: AC

## 2021-10-24 MED ORDER — NYSTATIN 100000 UNIT/GM EX CREA
TOPICAL_CREAM | Freq: Three times a day (TID) | CUTANEOUS | Status: DC
Start: 2021-10-24 — End: 2021-10-24

## 2021-10-24 MED ORDER — NYSTATIN 100000 UNIT/GM EX OINT
TOPICAL_OINTMENT | Freq: Three times a day (TID) | CUTANEOUS | Status: DC
Start: 2021-10-24 — End: 2021-10-24

## 2021-10-24 MED ORDER — DICLOXACILLIN SODIUM 500 MG PO CAPS: 500.0000 mg | ORAL_CAPSULE | Freq: Four times a day (QID) | ORAL | 0 refills | Status: DC

## 2021-10-24 NOTE — Progress Notes (Signed)
Postpartum Problem Visit  Reason for visit: Itching painful breast x 2 days   S) Lauren Mann is about 2 weeks PP. She has noticed both of her nipples, but more so on the Right has been itchy for 2 days.  She has also had the chills since yesterday and feels "ill", has had a headache. Denies any fevers.  Starting today she has noticed a firm and tender area on her right breast. This area does soften after nursing. Lauren Mann has not taken any Tylenol or Ibuprofen recently.   Yesterday she took her infant to the Pediatrician for a rash, while there a provider looked at her nipples and told her she has thrush and was given a script for Diflucan.  Her infant does not have thrush and was not given antifungal.   O) BP 120/80   Temp 97.8 F (36.6 C)   Ht 5\' 5"  (1.651 m)   Wt 170 lb (77.1 kg)   LMP  (LMP Unknown)   Breastfeeding Yes   BMI 28.29 kg/m   Gen: well groomed, tired and uncomfortable looking Breasts: Right breast: soft, no redness or masses, nipple rosy pink                Left breast: redness around entire breast, diffuse firm tender area at 11 O'clock, nipple rosy pink   A) Mastitis Yeast on Nipples  P) -Stop Diflucan, start Nystatin cream 2-3 times a day -Dicloxacillin 500mg  PO 4 times a day x 10 days  - Instructed to go home, rest, take Tylenol or Ibuprofen around the clock, empty breast every 2-3 hours, ice if needed. -Call if symptoms do not resolve within 48 hours

## 2021-10-28 ENCOUNTER — Telehealth: Payer: Self-pay

## 2021-10-28 NOTE — Telephone Encounter (Signed)
Pt calling about possibly getting her maternity leave extended from 6wks to 9wks; had a rough first two weeks with thrush and mastitis; baby did gain wt but is now losing wt; pediatrician recommended she nurse and supplement inbetween to get his weight back up.  [phone]

## 2021-10-29 NOTE — Telephone Encounter (Signed)
Pt aware; will call [employer] and tell them what's going on and they will probably send [place] more paperwork to reflect this extention.

## 2021-11-05 ENCOUNTER — Other Ambulatory Visit: Payer: Self-pay

## 2021-11-05 ENCOUNTER — Telehealth: Payer: Self-pay

## 2021-11-05 NOTE — Telephone Encounter (Signed)
Pt calling; was approved for three extra weeks maternity leave - from 6wks to 9wks; her case manager was supposed to have been sent; have we recv'd it?  [phone]

## 2021-11-06 ENCOUNTER — Encounter: Payer: Self-pay | Admitting: Obstetrics

## 2021-11-06 NOTE — Telephone Encounter (Signed)
Pt aware FMLA extention received today; pt to come in and see if she needs to fill out any forms and pay fee since she doesn't remember doing that with the first set of FMLA.

## 2021-11-06 NOTE — Telephone Encounter (Signed)
Just got them today. However, I do not know when they will be competed and no one is scheduled FMLA next week. I have sent nancy a message asking for one of [place] to have time for FMLA next week

## 2021-11-07 ENCOUNTER — Other Ambulatory Visit: Payer: Self-pay | Admitting: Advanced Practice Midwife

## 2021-11-12 ENCOUNTER — Telehealth: Payer: Self-pay

## 2021-11-12 NOTE — Telephone Encounter (Signed)
Additional information filled out for [employer].  Will get signature and process.

## 2021-11-14 ENCOUNTER — Encounter: Payer: Self-pay | Admitting: Advanced Practice Midwife

## 2021-11-17 DIAGNOSIS — Z419 Encounter for procedure for purposes other than remedying health state, unspecified: Secondary | ICD-10-CM | POA: Diagnosis not present

## 2021-11-19 ENCOUNTER — Ambulatory Visit (INDEPENDENT_AMBULATORY_CARE_PROVIDER_SITE_OTHER): Payer: 59 | Admitting: Licensed Practical Nurse

## 2021-11-19 ENCOUNTER — Ambulatory Visit (INDEPENDENT_AMBULATORY_CARE_PROVIDER_SITE_OTHER): Payer: 59 | Admitting: Surgery

## 2021-11-19 ENCOUNTER — Other Ambulatory Visit: Payer: Self-pay

## 2021-11-19 ENCOUNTER — Encounter: Payer: Self-pay | Admitting: Surgery

## 2021-11-19 ENCOUNTER — Ambulatory Visit: Payer: Self-pay

## 2021-11-19 ENCOUNTER — Encounter: Payer: Self-pay | Admitting: Licensed Practical Nurse

## 2021-11-19 VITALS — BP 122/74 | Temp 98.3°F | Wt 168.0 lb

## 2021-11-19 VITALS — BP 136/84 | HR 102 | Temp 98.3°F | Ht 65.0 in | Wt 166.0 lb

## 2021-11-19 DIAGNOSIS — O9112 Abscess of breast associated with the puerperium: Secondary | ICD-10-CM

## 2021-11-19 DIAGNOSIS — N63 Unspecified lump in unspecified breast: Secondary | ICD-10-CM

## 2021-11-19 DIAGNOSIS — O9123 Nonpurulent mastitis associated with lactation: Secondary | ICD-10-CM | POA: Diagnosis not present

## 2021-11-19 DIAGNOSIS — N61 Mastitis without abscess: Secondary | ICD-10-CM

## 2021-11-19 MED ORDER — CEPHALEXIN 500 MG PO CAPS: 500.0000 mg | ORAL_CAPSULE | Freq: Four times a day (QID) | ORAL | 0 refills | Status: AC

## 2021-11-19 NOTE — Telephone Encounter (Signed)
Patient is scheduled for 1:55 today

## 2021-11-19 NOTE — Progress Notes (Signed)
Patient ID: Lauren Mann, female   DOB: June 30, 1976, 46 y.o.   MRN: [id number]  Chief Complaint: Left breast abscess, postpartum  History of Present Illness Lauren Mann is a 46 y.o. female with a 43-week-old child who is still nursing.  Prior mastitis treated with dicloxacillin on December 8, had resolution of inflammatory changes, however has returned about a week ago.  Pain beginning yesterday.  Prescribed additional antibiotic.  Concerns of abscess present and referred here.  She is supplementing nursing with formula so cessation of nursing may not make a significant impact.  Has not begun second course of antibiotics with Keflex.  Past Medical History Past Medical History:  Diagnosis Date   History of chicken pox    History of kidney stones    History of UTI    Hyperlipidemia       Past Surgical History:  Procedure Laterality Date   NO PAST SURGERIES      Allergies  Allergen Reactions   Caffeine Palpitations    Current Outpatient Medications  Medication Sig Dispense Refill   cephALEXin (KEFLEX) 500 MG capsule Take 1 capsule (500 mg total) by mouth 4 (four) times daily. 40 capsule 0   labetalol (NORMODYNE) 100 MG tablet TAKE 1 TABLET BY MOUTH TWICE A DAY 60 tablet 1   nystatin cream (MYCOSTATIN) Apply 1 application topically 2 (two) times daily. 30 g 0   Prenatal Vit-Fe Fumarate-FA (MULTIVITAMIN-PRENATAL) 27-0.8 MG TABS tablet Take 1 tablet by mouth daily at 12 noon.     No current facility-administered medications for this visit.    Family History Family History  Problem Relation Age of Onset   Hyperlipidemia Father    Heart disease Father    Mental illness Sister    Breast cancer Paternal Aunt    Arthritis Maternal Grandmother    Kidney disease Maternal Grandmother    Diabetes Maternal Grandmother    Heart disease Paternal Grandfather       Social History Social History   Tobacco Use   Smoking status: Former    Types: Cigarettes    Quit date: 12/09/2008     Years since quitting: 12.9   Smokeless tobacco: Never  Vaping Use   Vaping Use: Never used  Substance Use Topics   Alcohol use: Not Currently    Alcohol/week: 0.0 - 1.0 standard drinks    Comment: very rare   Drug use: Not Currently    Types: Marijuana    Comment: when she was younger        Review of Systems  Constitutional:  Positive for chills, fever and malaise/fatigue.  HENT: Negative.    Eyes: Negative.   Respiratory: Negative.    Cardiovascular: Negative.   Gastrointestinal: Negative.   Genitourinary: Negative.   Skin: Negative.   Neurological:  Positive for dizziness and headaches.  Psychiatric/Behavioral: Negative.       Physical Exam Blood pressure 136/84, pulse (!) 102, temperature 98.3 F (36.8 C), height 5\' 5"  (1.651 m), weight 166 lb (75.3 kg), SpO2 98 %, currently breastfeeding. Last Weight  Most recent update: 11/19/2021  3:08 PM    Weight  75.3 kg (166 lb)             CONSTITUTIONAL: Well developed, and nourished, appropriately responsive and aware without distress.   EYES: Sclera non-icteric.   EARS, NOSE, MOUTH AND THROAT: Mask worn.    Hearing is intact to voice.  NECK: Trachea is midline, and there is no jugular venous distension.  LYMPH NODES:  Lymph nodes in the neck are not enlarged. RESPIRATORY:  Lungs are clear, and breath sounds are equal bilaterally. Normal respiratory effort without pathologic use of accessory muscles. CARDIOVASCULAR: Heart is regular in rate and rhythm. GI: The abdomen is  soft, nontender, and nondistended.  GU: Caryl Lyn is present as chaperone.  There is a fluctuant indurated mass in the upper left breast not far from the nipple areolar complex.  Hyperemia/erythema noted there.  No defects to the skin appreciated. With sterile prep and ultrasound guidance utilized infiltration of 1% lidocaine with epinephrine, I then switched to a larger gauge needle for aspiration of the cyst/abscess noted on ultrasound.  I was able to  aspirate some purulent drainage, and this was set aside to send a specimen for culture and sensitivity.  I then applied a new syringe half full of local anesthetic for irrigating the cavity.  On exam it appeared the fluctuant area was persistent, but I suspect after an attempt to aspirate it further that this was a blood-filled cavity rather than pus filled as I was only able to aspirate blood.  We then applied a Band-Aid over the needle puncture site and applied ice to the patient's left breast.  Instructions given. MUSCULOSKELETAL:  Symmetrical muscle tone appreciated in all four extremities.    SKIN: Skin turgor is normal. No pathologic skin lesions appreciated.  NEUROLOGIC:  Motor and sensation appear grossly normal.  Cranial nerves are grossly without defect. PSYCH:  Alert and oriented to person, place and time. Affect is appropriate for situation.  Data Reviewed I have personally reviewed what is currently available of the patient's imaging, recent labs and medical records.   Labs:  CBC Latest Ref Rng & Units 10/15/2021 10/14/2021 09/27/2021  WBC 4.0 - 10.5 K/uL 10.7(H) 9.2 8.5  Hemoglobin 12.0 - 15.0 g/dL 9.5(L) 12.4 11.3  Hematocrit 36.0 - 46.0 % 27.8(L) 36.1 32.2(L)  Platelets 150 - 400 K/uL 224 240 245   CMP Latest Ref Rng & Units 10/14/2021 09/27/2021 03/19/2021  Glucose 70 - 99 mg/dL 91 88 86  BUN 6 - 20 mg/dL 8 10 10   Creatinine 0.44 - 1.00 mg/dL 0.53 0.49(L) 0.70  Sodium 135 - 145 mmol/L 136 137 141  Potassium 3.5 - 5.1 mmol/L 3.8 3.9 4.4  Chloride 98 - 111 mmol/L 107 101 103  CO2 22 - 32 mmol/L 19(L) 21 21  Calcium 8.9 - 10.3 mg/dL 8.8(L) 9.6 9.9  Total Protein 6.5 - 8.1 g/dL 7.0 6.0 7.2  Total Bilirubin 0.3 - 1.2 mg/dL 0.6 <0.2 <0.2  Alkaline Phos 38 - 126 U/L 97 92 57  AST 15 - 41 U/L 23 11 17   ALT 0 - 44 U/L 9 6 15       Imaging: Dynamic ultrasound imaging during procedure.   Assessment    Postpartum left breast abscess.  See GU exam above for procedure note.   Successful aspiration with likely immediate hematoma. Patient Active Problem List   Diagnosis Date Noted   Encounter for care or examination of lactating mother 10/16/2021   Postpartum care following vaginal delivery 10/14/2021   Preeclampsia, third trimester 10/04/2021   Multigravida of advanced maternal age in first trimester 04/12/2021   Supervision of high risk pregnancy, antepartum 03/19/2021   History of pre-eclampsia in prior pregnancy, currently pregnant 03/19/2021    Plan    Follow-up culture and sensitivity, proceed with taking Keflex as prescribed.  Return in 2 days for repeat ultrasound evaluation and likely repeat of aspiration, as needed.  Face-to-face time spent with the patient and accompanying care providers(if present) was 45 minutes, with more than 50% of the time spent counseling, educating, and coordinating care of the patient.    These notes generated with voice recognition software. I apologize for typographical errors.  Ronny Bacon M.D., FACS 11/19/2021, 3:53 PM

## 2021-11-19 NOTE — Telephone Encounter (Signed)
Can you schedule pt with lydia please.

## 2021-11-19 NOTE — Progress Notes (Signed)
Chief Complaint: Mastitis   HPI: Lauren Mann was treated for both thrush and Mastitis in the beginning of December, the mastitis resolved.  Over the last week she noticed a lump  in her left breast it sort of what improving, but today is has gotten worse, it is larger, more painful and she now has aches and chills.   Visit kept short given need for urgent US/aspiration   O) BP 122/74    Temp 98.3 F (36.8 C)    Wt 168 lb (76.2 kg)    Breastfeeding Yes    BMI 27.96 kg/m   Gen: Tired appearing, well groomed Breasts: Left breast redness encircling entire breast. Tender to touch.  Visible mass at 12 O'clock, this mass is tender to palpation, clear edges and fluctuant it is about the size of a bouncy ball.  Right breast: no redness or masses   A) Mastitis with possible abscess  P) Sent directly to general surgery for [place] and aspiration-see notes Script for Keflex sent to pharmacy on file Pt has follow up scheduled with general surgery   Carie Caddy, CNM  Domingo Pulse, [facility]  11/19/21  5:00 PM

## 2021-11-19 NOTE — Patient Instructions (Signed)
Keep ice on the area for 15 minutes several times today. Tylenol or Ibuprofen for any discomfort. Start your antibiotics you were prescribed.  Follow up here on Thursday for possible aspiration.

## 2021-11-19 NOTE — Telephone Encounter (Signed)
Do you want to see her again? Or just call in RX?

## 2021-11-21 ENCOUNTER — Encounter: Payer: Self-pay | Admitting: Surgery

## 2021-11-21 ENCOUNTER — Other Ambulatory Visit: Payer: Self-pay

## 2021-11-21 ENCOUNTER — Ambulatory Visit (INDEPENDENT_AMBULATORY_CARE_PROVIDER_SITE_OTHER): Payer: 59 | Admitting: Surgery

## 2021-11-21 VITALS — BP 142/92 | HR 93 | Temp 98.4°F | Ht 65.0 in | Wt 165.0 lb

## 2021-11-21 DIAGNOSIS — O9112 Abscess of breast associated with the puerperium: Secondary | ICD-10-CM | POA: Diagnosis not present

## 2021-11-21 NOTE — Patient Instructions (Signed)
Change the gauze daily and as needed. Shower tomorrow morning, take the dressing off first and let the warm soapy water run over the area, try to express any fluid that may want to come out. Rinse well and pat dry. Redress the area with dry gauze and tape.  Follow up here on Tuesday.  Call if you develop a fever over 100, or spreading redness, or a foul odor.

## 2021-11-21 NOTE — Progress Notes (Signed)
48-hour follow-up after left breast abscess aspiration.  It appears the abscess cavity immediately refilled, and despite little to no ecchymosis, I suspect a hematoma filled the abscess cavity.  I discussed the need for better drainage, and upon ultrasound evaluation I am concerned there may be too much clotted blood to warrant repeat aspiration. I suggested we proceed with a little mini I&D, with the idea of expressing the serosanguineous/purulent drainage and reevaluate in a few days for possible irrigation versus repeat aspiration versus mini I&D again.  I believe Lauren Mann understands and desires to proceed.  Procedure detail: I&D left breast abscess. Left breast is prepped with Betadine over the fluctuant mass of the cephalad aspect 2 to 3 cm cephalad to the areolar margin.  Local infiltration of 1% lidocaine with epinephrine is performed with anesthetic effect.  An 11 blade is used to make a subcentimeter incision, thus enabling easy expression of the serosanguineous purulent material.  Some clots were noted.  It drained and remained empty.  Dry dressing was applied for absorption.  Patient is advised to continue taking her antibiotic, Lauren Mann may shower and replace the dressing.  Lauren Mann is to encourage expressing any further drainage.  We will see her back Tuesday next week to evaluate for any further intervention.

## 2021-11-26 ENCOUNTER — Encounter: Payer: Self-pay | Admitting: Surgery

## 2021-11-26 ENCOUNTER — Ambulatory Visit (INDEPENDENT_AMBULATORY_CARE_PROVIDER_SITE_OTHER): Payer: 59 | Admitting: Surgery

## 2021-11-26 ENCOUNTER — Other Ambulatory Visit: Payer: Self-pay

## 2021-11-26 VITALS — BP 142/93 | HR 73 | Temp 98.4°F | Ht 65.0 in | Wt 164.0 lb

## 2021-11-26 DIAGNOSIS — Z1610 Resistance to unspecified beta lactam antibiotics: Secondary | ICD-10-CM

## 2021-11-26 DIAGNOSIS — O9112 Abscess of breast associated with the puerperium: Secondary | ICD-10-CM

## 2021-11-26 DIAGNOSIS — A4902 Methicillin resistant Staphylococcus aureus infection, unspecified site: Secondary | ICD-10-CM

## 2021-11-26 NOTE — Progress Notes (Signed)
Surgical Clinic Progress/Follow-up Note   HPI:  46 y.o. Female presents to clinic for left breast abscess follow-up 5  days follow the last evaluation. Patient reports  improvement/resolution of prior issues and reported all drainage stopped on Sunday.  She continued taking Keflex.  She denies any fevers and chills.  We discussed with her that her cultures confirmed MRSA, and were anticipating measures to diminish colonization, and treat with oral antibiotics. She reports that she has stopped breast-feeding.  Has some concerns that her child may have had some congestion that might be related and we did recommended discussing this with her pediatrician. But she is feeling significantly better. Review of Systems:  Constitutional: denies fever/chills  ENT: denies sore throat, hearing problems  Respiratory: denies shortness of breath, wheezing  Cardiovascular: denies chest pain, palpitations  Gastrointestinal: denies abdominal pain, N/V, or diarrhea/and bowel function as per interval history Skin: Denies any other rashes or skin discolorations except post-surgical wounds as per interval history  Vital Signs:  BP (!) 142/93    Pulse 73    Temp 98.4 F (36.9 C)    Ht 5\' 5"  (1.651 m)    Wt 164 lb (74.4 kg)    LMP 11/22/2021 (Exact Date)    SpO2 98%    BMI 27.29 kg/m    Physical Exam:  Constitutional:  -- Normal body habitus  -- Awake, alert, and oriented x3  Pulmonary:  -- No crackles -- Equal breath sounds bilaterally -- Breathing non-labored at rest Cardiovascular:  -- S1, S2 present  -- No pericardial rubs  Gastrointestinal:  -- Soft and non-distended, non-tender  GU  --left breast, with expected residual firmness from induration.  No evidence of active inflammatory changes with erythema or persisting tenderness.  -- Post-surgical incision is  well-approximated without any peri-incisional erythema or drainage Musculoskeletal / Integumentary:  -- Wounds or skin discoloration: None  appreciated except post-surgical incisions as described above -- Extremities: B/L UE and LE FROM, hands and feet warm, no edema   Laboratory studies: MRSA: See microbiology for other resistances.  Imaging: No new pertinent imaging available for review   Assessment:  46 y.o. yo Female with a problem list including...  Patient Active Problem List   Diagnosis Date Noted   Abscess of breast, postpartum 11/19/2021   Encounter for care or examination of lactating mother 10/16/2021   Postpartum care following vaginal delivery 10/14/2021   Preeclampsia, third trimester 10/04/2021   Multigravida of advanced maternal age in first trimester 04/12/2021   Supervision of high risk pregnancy, antepartum 03/19/2021   History of pre-eclampsia in prior pregnancy, currently pregnant 03/19/2021    presents to clinic for follow-up evaluation of left breast abscess, secondary to MRSA, progressing well.  Plan:              - return to clinic as needed, instructed to call office if any questions or concerns  -We discussed the pros and cons of proceeding with treatment with antibiotic.  Considering this is a relatively superficial abscess and appears to be fully drained and there is no evidence of cellulitic changes despite the anticipated induration that lingers.  We will watch and see how she does over the next few days and she will inform 05/19/2021 if there is been deterioration.  We also discussed the need to treat potential colonization and recommended she bring up the matter to her pediatrician considering her infant child. All of the above recommendations were discussed with the patient, and all of patient's questions  were answered to her expressed satisfaction.  These notes generated with voice recognition software. I apologize for typographical errors.  Campbell Lerner, MD, FACS [place]: [facility] General Surgery - Partnering for exceptional care. Office: [phone]

## 2021-11-26 NOTE — Patient Instructions (Signed)
Finish all of your antibiotics. Watch the area and let us know if the area starts to get red and infected again.  Follow-up with our office as needed.  Please call and ask to speak with a nurse if you develop questions or concerns.

## 2021-11-27 LAB — ANAEROBIC AND AEROBIC CULTURE

## 2021-11-28 ENCOUNTER — Encounter: Payer: Self-pay | Admitting: Advanced Practice Midwife

## 2021-11-28 ENCOUNTER — Other Ambulatory Visit: Payer: Self-pay

## 2021-11-28 ENCOUNTER — Ambulatory Visit (INDEPENDENT_AMBULATORY_CARE_PROVIDER_SITE_OTHER): Payer: 59 | Admitting: Advanced Practice Midwife

## 2021-11-28 NOTE — Progress Notes (Signed)
Postpartum Visit  Chief Complaint:  Chief Complaint  Patient presents with   Postpartum Care    History of Present Illness: Patient is a 46 y.o. [id number] presents for postpartum visit.  Review the Delivery Report for details.   Date of delivery: 10/14/2021 Type of delivery: Vaginal delivery - Vacuum or forceps assisted  no Episiotomy No.  Laceration: 1st degree with repair  Pregnancy or labor problems:  severe preeclampsia Any problems since the delivery:  thrush/mastitis with breast abscess  Newborn Details:  SINGLETON :  1. BabyGender female. Birth weight: 3320 g Maternal Details:  Breast or formula feeding: recently discontinued breastfeeding/now formula Intercourse: No  Contraception after delivery:  condoms Any bowel or bladder issues: No  Post partum depression/anxiety noted:  no Edinburgh Post-Partum Depression Score: 0 Date of last PAP: May 2022  no abnormalities   Review of Systems: Review of Systems  Constitutional:  Negative for chills and fever.  HENT:  Negative for congestion, ear discharge, ear pain, hearing loss, sinus pain and sore throat.   Eyes:  Negative for blurred vision and double vision.  Respiratory:  Negative for cough, shortness of breath and wheezing.   Cardiovascular:  Negative for chest pain, palpitations and leg swelling.  Gastrointestinal:  Negative for abdominal pain, blood in stool, constipation, diarrhea, heartburn, melena, nausea and vomiting.  Genitourinary:  Negative for dysuria, flank pain, frequency, hematuria and urgency.  Musculoskeletal:  Negative for back pain, joint pain and myalgias.  Skin:  Negative for itching and rash.  Neurological:  Negative for dizziness, tingling, tremors, sensory change, speech change, focal weakness, seizures, loss of consciousness, weakness and headaches.  Endo/Heme/Allergies:  Negative for environmental allergies. Does not bruise/bleed easily.  Psychiatric/Behavioral:  Negative for depression,  hallucinations, memory loss, substance abuse and suicidal ideas. The patient is not nervous/anxious and does not have insomnia.      Past Medical History:  Past Medical History:  Diagnosis Date   History of chicken pox    History of kidney stones    History of UTI    Hyperlipidemia     Past Surgical History:  Past Surgical History:  Procedure Laterality Date   NO PAST SURGERIES      Family History:  Family History  Problem Relation Age of Onset   Hyperlipidemia Father    Heart disease Father    Mental illness Sister    Breast cancer Paternal Aunt    Arthritis Maternal Grandmother    Kidney disease Maternal Grandmother    Diabetes Maternal Grandmother    Heart disease Paternal Grandfather     Social History:  Social History   Socioeconomic History   Marital status: Married    Spouse name: [name]   Number of children: 2   Years of education: Not on file   Highest education level: Not on file  Occupational History   Not on file  Tobacco Use   Smoking status: Former    Types: Cigarettes    Quit date: 12/09/2008    Years since quitting: 12.9   Smokeless tobacco: Never  Vaping Use   Vaping Use: Never used  Substance and Sexual Activity   Alcohol use: Not Currently    Alcohol/week: 0.0 - 1.0 standard drinks    Comment: very rare   Drug use: Not Currently    Types: Marijuana    Comment: when she was younger   Sexual activity: Yes    Birth control/protection: None  Other Topics Concern   Not on  file  Social History Narrative   Not on file   Social Determinants of Health   Financial Resource Strain: Not on file  Food Insecurity: No Food Insecurity   Worried About Charity fundraiser in the Last Year: Never true   Ran Out of Food in the Last Year: Never true  Transportation Needs: No Transportation Needs   Lack of Transportation (Medical): No   Lack of Transportation (Non-Medical): No  Physical Activity: Not on file  Stress: Not on file  Social  Connections: Not on file  Intimate Partner Violence: Not on file    Allergies:  Allergies  Allergen Reactions   Caffeine Palpitations    Medications: Prior to Admission medications   Medication Sig Start Date End Date Taking? Authorizing Provider  cephALEXin (KEFLEX) 500 MG capsule Take 1 capsule (500 mg total) by mouth 4 (four) times daily. 11/19/21   Dominic, Nunzio Cobbs, CNM  nystatin cream (MYCOSTATIN) Apply 1 application topically 2 (two) times daily. 10/24/21   Dominic, Nunzio Cobbs, CNM  Prenatal Vit-Fe Fumarate-FA (MULTIVITAMIN-PRENATAL) 27-0.8 MG TABS tablet Take 1 tablet by mouth daily at 12 noon.    [provider]    Physical Exam Blood pressure 126/84, weight 166 lb (75.3 kg), last menstrual period 11/22/2021    General: NAD HEENT: normocephalic, anicteric Pulmonary: No increased work of breathing Abdomen: NABS, soft, non-tender, non-distended.  Umbilicus without lesions.  No hepatomegaly, splenomegaly or masses palpable. No evidence of hernia. Genitourinary:  External: Normal external female genitalia.  Normal urethral meatus, normal  Bartholin's and Skene's glands.    Vagina: Normal vaginal mucosa, no evidence of prolapse. Slight irritation noted at site of laceration  Cervix: Grossly normal in appearance, no bleeding, no CMT  Uterus: Non-enlarged, mobile, normal contour.    Adnexa: ovaries non-enlarged, no adnexal masses  Rectal: deferred Extremities: no edema, erythema, or tenderness Neurologic: Grossly intact Psychiatric: mood appropriate, affect full    Assessment: 46 y.o. [id number] presenting for 6 week postpartum visit  Plan: Problem List Items Addressed This Visit   None Visit Diagnoses     6 weeks postpartum follow-up    -  Primary        1) Contraception - Education given regarding options for contraception, as well as compatibility with breast feeding if applicable.  Patient plans on condoms for contraception.  2)  Pap - ASCCP  guidelines and rationale discussed.  Patient opts for every 3 years screening interval  3) Patient underwent screening for postpartum depression with no signs of depression  4) Preeclampsia: she has been normo tensive in the last few weeks and discontinued Labetalol. Normal BP today.  5) Return in about 1 year (around 11/28/2022) for annual established gyn.   Rod Can, [facility] 11/28/2021, 2:01 PM

## 2021-11-29 ENCOUNTER — Other Ambulatory Visit: Payer: Self-pay | Admitting: *Deleted

## 2021-11-29 ENCOUNTER — Encounter: Payer: Self-pay | Admitting: Advanced Practice Midwife

## 2021-11-29 NOTE — Patient Instructions (Signed)
Visit Information  Lauren Mann was given information about Medicaid Managed Care team care coordination services as a part of their [facility] Medicaid benefit. Lauren Mann verbally consented to engagement with the [facility] Managed Care team.   If you are experiencing a medical emergency, please call 911 or report to your local emergency department or urgent care.   If you have a non-emergency medical problem during routine business hours, please contact your provider's office and ask to speak with a nurse.   For questions related to your [facility] health plan, please call: [phone] or go here:[email or web address]  If you would like to schedule transportation through your [facility] plan, please call the following number at least 2 days in advance of your appointment: [phone].  Call the [place] at [phone], at any time, 24 hours a day, 7 days a week. If you are in danger or need immediate medical attention call 911.  If you would like help to quit smoking, call 1-800-QUIT-NOW 7401065840) OR Espaol: 1-855-Djelo-Ya (3-875-643-3295) o para ms informacin haga clic aqu or Text READY to 200-400 to register via text  Lauren Mann - following are the goals we discussed in your visit today:   Goals Addressed   None     Please see education materials related to HTN provided by MyChart link.  Patient has access to MyChart and can view provided education  No further follow up required:    Lurena Joiner RN, Joshua RN Care Coordinator   Following is a copy of your plan of care:  Care Plan : [place] of Care  Updates made by Melissa Montane, RN since 11/29/2021 12:00 AM  Completed 11/29/2021   Problem: Healthcare Management Needs in OB Patient with Pre-eclampsia Resolved 11/29/2021  Priority: High     Long-Range Goal: Development of Plan of Care to address needs and knowledge deficits  in OB Patient with Pre-eclampsia Completed 11/29/2021  Start Date: 10/04/2021  Expected End Date: 12/03/2021  Priority: High  Note:   Current Barriers:  Knowledge Deficits related to plan of care for management of Pre-eclampsia -Lauren Mann delivered 10/14/21. She attending her post partum follow up on 11/28/21. BP in normal range, no longer needing to take labetalol. Last BP 124/86  RNCM Clinical Goal(s):  Patient will verbalize understanding of plan for management of Pre-eclampsia as evidenced by verbalization and self monitoring take all medications exactly as prescribed and will call provider for medication related questions as evidenced by documentation in EMR    attend all scheduled medical appointments: 11/28/21 @ 1:15 for postpartum visit as evidenced by physician documentation in EMR            Interventions: Inter-disciplinary care team collaboration (see longitudinal plan of care) Evaluation of current treatment plan related to  self management and patient's adherence to plan as established by provider Advised patient to follow up with PCP for concerns or questions Reviewed signs of high BP and instructed patient to notify PCP with elevated readings   Pre-eclampsia :  (Status: Goal Met.) Long Term Goal  Recommended adequate rest   Provided education on preeclampsia   Advised to monitor BP as directed by OB provider Contact OB provider with questions or concerns Decrease salt intake Advised to take labetalol as directed  Patient Goals/Self-Care Activities: Patient will self administer medications as prescribed as evidenced by self report/primary caregiver report  Patient will attend all scheduled provider appointments as evidenced  by clinician review of documented attendance to scheduled appointments and patient/caregiver report Patient will continue to perform ADL's independently as evidenced by patient/caregiver report Patient will continue to perform IADL's independently as  evidenced by patient/caregiver report Patient will call provider office for new concerns or questions as evidenced by review of documented incoming telephone call notes and patient report

## 2021-11-29 NOTE — Patient Outreach (Signed)
Medicaid Managed Care   Nurse Care Manager Note  11/29/2021 Name:  Lauren Mann MRN:  [id number] DOB:  07-08-1976  Lauren Mann is an 46 y.o. year old female who is a primary patient of Lauren Spikes, DO.  The [facility] Managed Care Coordination team was consulted for assistance with:    Obstetrics healthcare management needs  Lauren Mann was given information about Medicaid Managed Care Coordination team services today. [place] Patient agreed to services and verbal consent obtained.  Engaged with patient by telephone for follow up visit in response to provider referral for case management and/or care coordination services.   Assessments/Interventions:  Review of past medical history, allergies, medications, health status, including review of consultants reports, laboratory and other test data, was performed as part of comprehensive evaluation and provision of chronic care management services.  SDOH (Social Determinants of Health) assessments and interventions performed:   Care Plan  Allergies  Allergen Reactions   Caffeine Palpitations    Medications Reviewed Today     Reviewed by Lauren Montane, RN (Registered Nurse) on 11/29/21 at 48  Med List Status: <None>   Medication Order Taking? Sig Documenting Provider Last Dose Status Informant  cephALEXin (KEFLEX) 500 MG capsule [id number] No Take 1 capsule (500 mg total) by mouth 4 (four) times daily.  Patient not taking: Reported on 11/29/2021   Lauren Mann, [place] Not Taking Active            Med Note (Lauren Mann A   Fri Nov 29, 2021 10:28 AM) completed  nystatin cream (MYCOSTATIN) [id number] No Apply 1 application topically 2 (two) times daily.  Patient not taking: Reported on 11/29/2021   Lauren Mann, CNM Not Taking Active   Prenatal Vit-Fe Fumarate-FA (MULTIVITAMIN-PRENATAL) 27-0.8 MG TABS tablet [id number] Yes Take 1 tablet by mouth daily at 12 noon. [provider] Taking Active Self             Patient Active Problem List   Diagnosis Date Noted   MRSA (methicillin resistant Staphylococcus aureus) infection 11/26/2021   Abscess of breast, postpartum 11/19/2021   Encounter for care or examination of lactating mother 10/16/2021   Postpartum care following vaginal delivery 10/14/2021    Conditions to be addressed/monitored per PCP order:   Obstetric health management needs  Care Plan : RN Care Manager Plan of Care  Updates made by Lauren Montane, RN since 11/29/2021 12:00 AM     Problem: Healthcare Management Needs in OB Patient with Pre-eclampsia   Priority: High     Long-Range Goal: Development of Plan of Care to address needs and knowledge deficits in OB Patient with Pre-eclampsia   Start Date: 10/04/2021  Expected End Date: 12/03/2021  Priority: High  Note:   Current Barriers:  Knowledge Deficits related to plan of care for management of Pre-eclampsia -Lauren Mann delivered 10/14/21. She attending her post partum follow up on 11/28/21. BP in normal range, no longer needing to take labetalol. Last BP 124/86  RNCM Clinical Goal(s):  Patient will verbalize understanding of plan for management of Pre-eclampsia as evidenced by verbalization and self monitoring take all medications exactly as prescribed and will call provider for medication related questions as evidenced by documentation in EMR    attend all scheduled medical appointments: 11/28/21 @ 1:15 for postpartum visit as evidenced by physician documentation in EMR            Interventions: Inter-disciplinary care team collaboration (see longitudinal plan of care)  Evaluation of current treatment plan related to  self management and patient's adherence to plan as established by provider Advised patient to follow up with PCP for concerns or questions Reviewed signs of high BP and instructed patient to notify PCP with elevated readings   Pre-eclampsia :  (Status: Goal Met.) Long Term Goal  Recommended adequate rest    Provided education on preeclampsia   Advised to monitor BP as directed by OB provider Contact OB provider with questions or concerns Decrease salt intake Advised to take labetalol as directed  Patient Goals/Self-Care Activities: Patient will self administer medications as prescribed as evidenced by self report/primary caregiver report  Patient will attend all scheduled provider appointments as evidenced by clinician review of documented attendance to scheduled appointments and patient/caregiver report Patient will continue to perform ADL's independently as evidenced by patient/caregiver report Patient will continue to perform IADL's independently as evidenced by patient/caregiver report Patient will call provider office for new concerns or questions as evidenced by review of documented incoming telephone call notes and patient report       Follow Up:  Patient agrees to Care Plan and Follow-up. Completing Care Plan today, no follow up indicated  Plan: The  Patient has been provided with contact information for the Managed Medicaid care management team and has been advised to call with any health related questions or concerns.  Date/time of next scheduled RN care management/care coordination outreach:  no follow up needed  Lauren Joiner RN, [place] RN Care Coordinator

## 2021-12-04 ENCOUNTER — Telehealth: Payer: Self-pay | Admitting: Surgery

## 2021-12-04 NOTE — Telephone Encounter (Signed)
Called back and left a message with Katrina w/[employer]. We have not received any paperwork from them regarding this patient. We also did not have the patient out of work as she is on postpartum leave currently. Her ObGyn office may have this information. She may call back if she has anymore questions.

## 2021-12-04 NOTE — Telephone Encounter (Signed)
Katrina w/[employer] called aware that the pt has an appointment w/Dr. Claudine Mouton on 11/26/21, & wanted to clarify the return to work date provided. Please advise by calling back @ [phone] & provide claim # [id number].  Thank you

## 2021-12-11 ENCOUNTER — Other Ambulatory Visit: Payer: Self-pay | Admitting: Advanced Practice Midwife

## 2021-12-18 DIAGNOSIS — Z419 Encounter for procedure for purposes other than remedying health state, unspecified: Secondary | ICD-10-CM | POA: Diagnosis not present

## 2022-01-15 DIAGNOSIS — Z419 Encounter for procedure for purposes other than remedying health state, unspecified: Secondary | ICD-10-CM | POA: Diagnosis not present

## 2022-02-15 DIAGNOSIS — Z419 Encounter for procedure for purposes other than remedying health state, unspecified: Secondary | ICD-10-CM | POA: Diagnosis not present

## 2022-03-17 DIAGNOSIS — Z419 Encounter for procedure for purposes other than remedying health state, unspecified: Secondary | ICD-10-CM | POA: Diagnosis not present

## 2022-04-08 IMAGING — US US MFM OB FOLLOW-UP
1 series · 14 of 28 positions shown · non-contrast
Comparison: none

[Series 1: us mfm ob follow-up · 56 acquisitions, 14 frames shown]
[im 3/56]
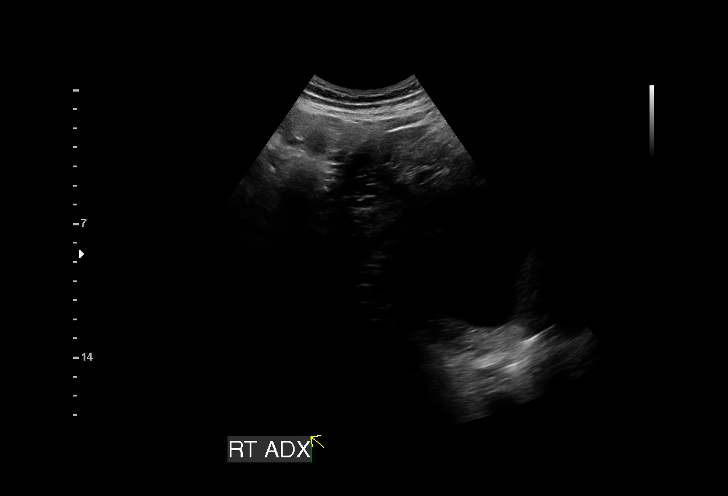
[im 7/56]
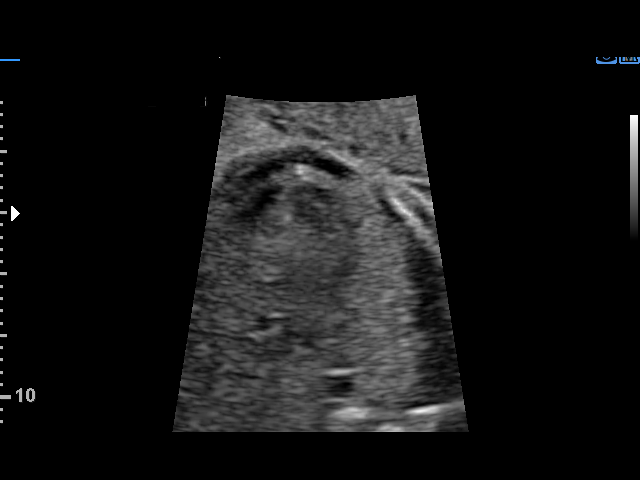
[im 11/56]
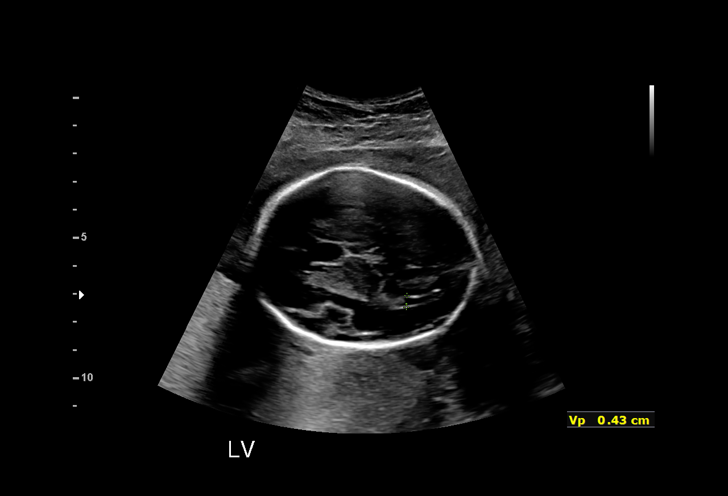
[im 15/56]
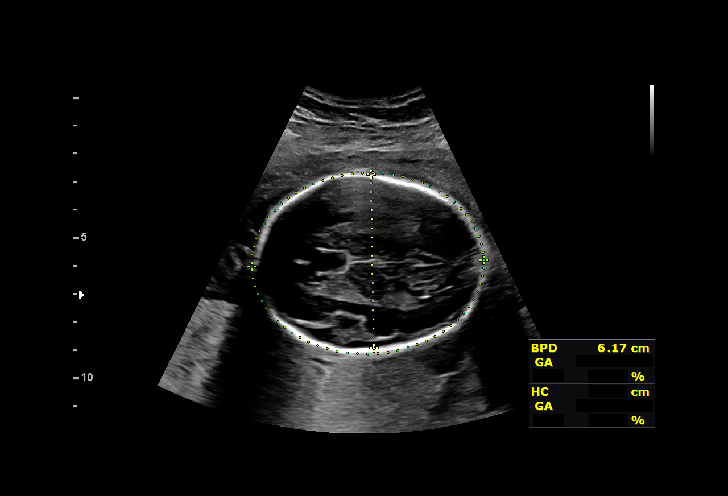
[im 19/56]
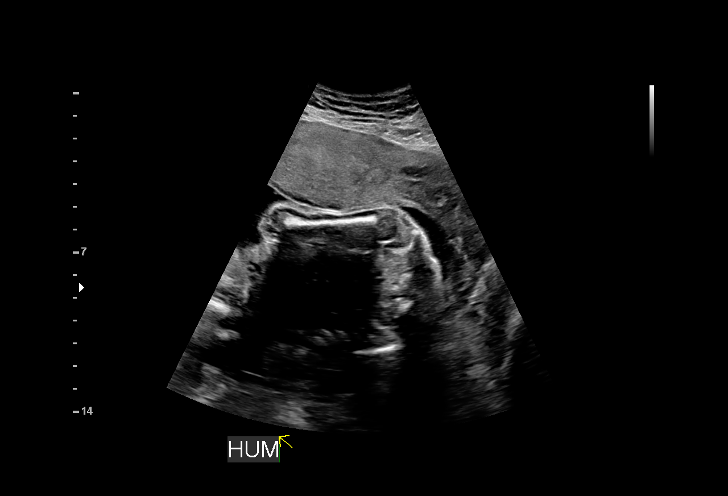
[im 23/56]
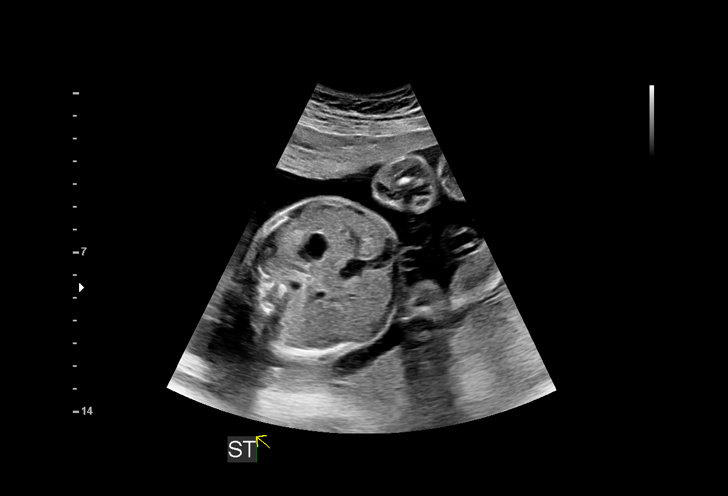
[im 27/56]
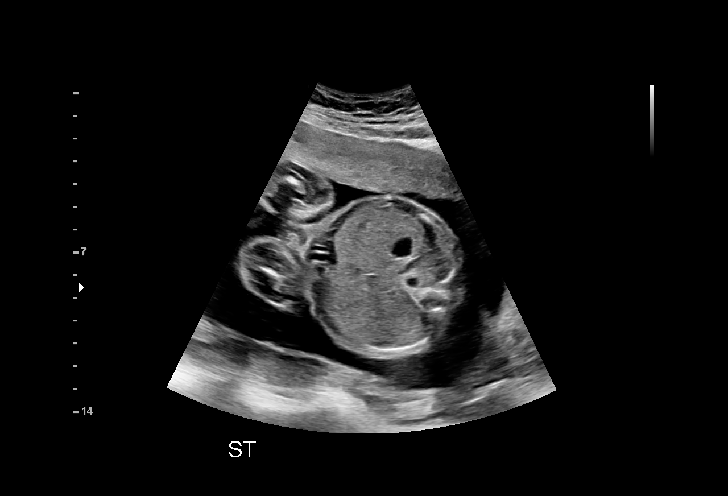
[im 31/56]
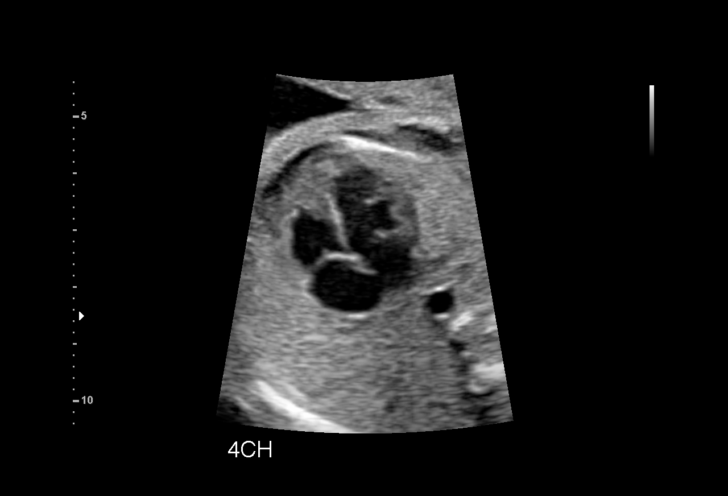
[im 35/56]
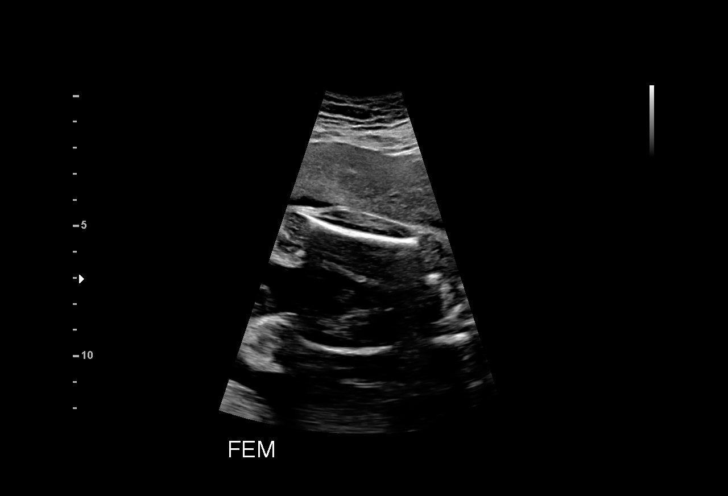
[im 39/56]
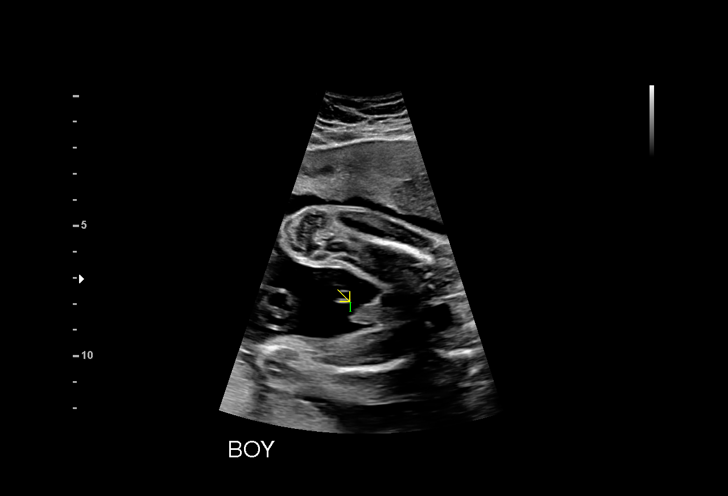
[im 43/56]
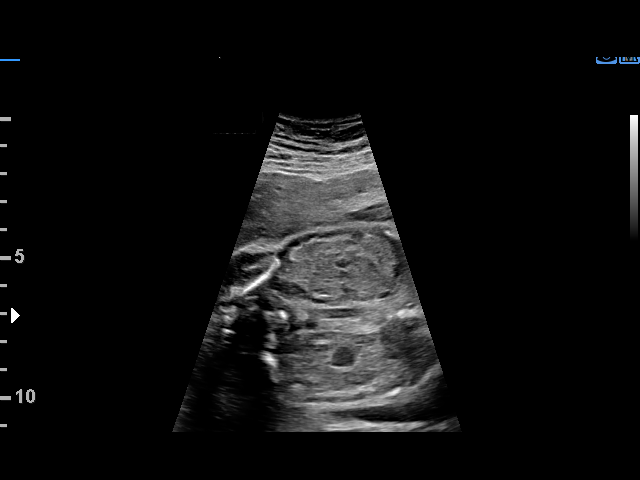
[im 47/56]
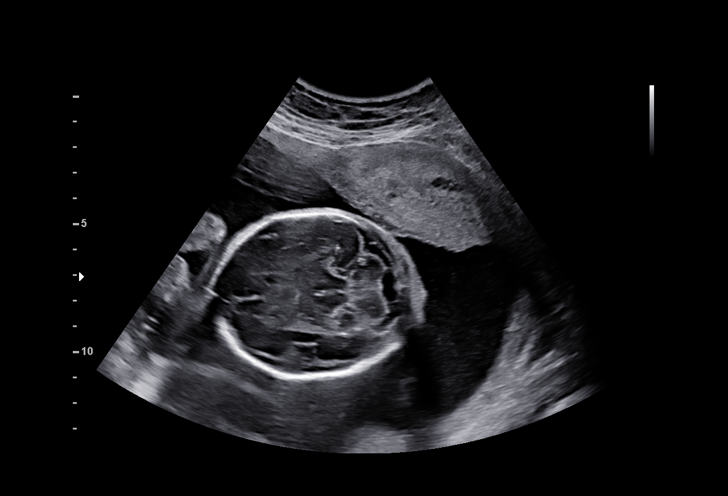
[im 51/56]
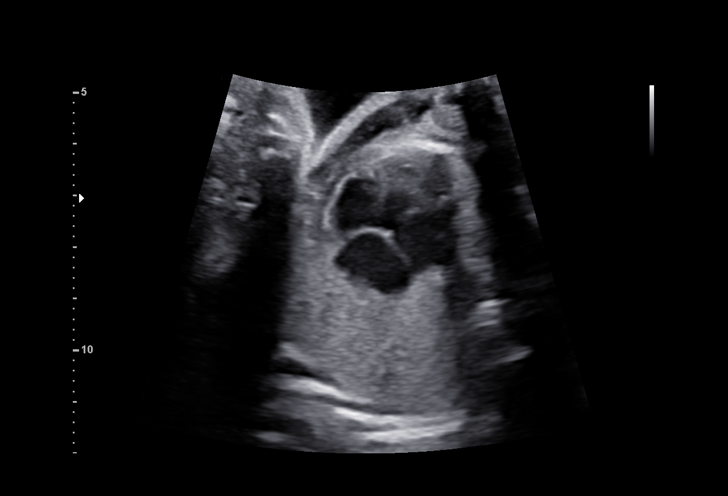
[im 56/56]
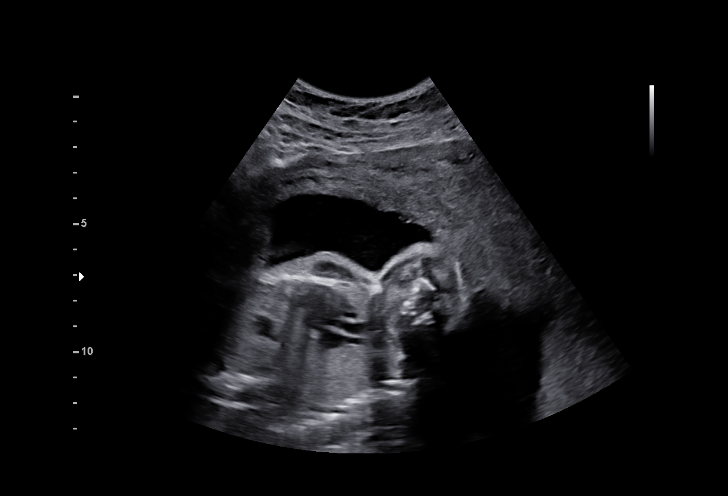

[14 of 28 positions shown; findings below may reference images not displayed]

Indications

 Advanced maternal age multigravida 45,
 second trimester
 Poor obstetric history: Previous preterm
 delivery, antepartum x 2
 Poor obstetric history: Previous
 preeclampsia / eclampsia/gestational HTN
 24 weeks gestation of pregnancy
 Encounter for other antenatal screening
 follow-up
 Low risk NIPS, neg CF, SMA, Fragile X
Fetal Evaluation

 Num Of Fetuses:         1
 Preg. Location:         Intrauterine
 Fetal Heart Rate(bpm):  152
 Cardiac Activity:       Observed
 Presentation:           Transverse, head to maternal left
 Placenta:               Posterior

 Amniotic Fluid
 AFI FV:      Within normal limits

                             Largest Pocket(cm)

Biometry

 BPD:        62  mm     G. Age:  25w 1d         79  %    CI:        72.34   %    70 - 86
                                                         FL/HC:      18.5   %    18.7 -
 HC:      231.9  mm     G. Age:  25w 2d         72  %    HC/AC:      1.01        1.05 -
 AC:      228.7  mm     G. Age:  27w 2d         99  %    FL/BPD:     69.4   %    71 - 87
 FL:         43  mm     G. Age:  24w 0d         35  %    FL/AC:      18.8   %    20 - 24
 HUM:      42.2  mm     G. Age:  25w 3d         72  %
 LV:        4.3  mm

 Est. FW:     857  gm    1 lb 14 oz      98  %
OB History

 Gravidity:    4         Term:   0        Prem:   2        SAB:   1
 TOP:          0       Ectopic:  0        Living: 2
Gestational Age

 LMP:           24w 1d        Date:  01/28/21                 EDD:   11/04/21
 U/S Today:     25w 3d                                        EDD:   10/26/21
 Best:          24w 1d     Det. By:  LMP  (01/28/21)          EDD:   11/04/21
Anatomy

 Cranium:               Appears normal         LVOT:                   Previously seen
 Cavum:                 Appears normal         Aortic Arch:            Previously seen
 Ventricles:            Appears normal         Ductal Arch:            Previously seen
 Choroid Plexus:        Previously seen        Diaphragm:              Appears normal
 Cerebellum:            Previously seen        Stomach:                Appears normal, left
                                                                       sided
 Posterior Fossa:       Previously seen        Abdomen:                Previously seen
 Nuchal Fold:           Previously seen        Abdominal Wall:         Previously seen
 Face:                  Orbits and profile     Cord Vessels:           Appears normal (3
                        previously seen                                vessel cord)
 Lips:                  Previously seen        Kidneys:                Appear normal
 Palate:                Not well visualized    Bladder:                Appears normal
 Thoracic:              Appears normal         Spine:                  Previously seen
 Heart:                 Appears normal; EIF    Upper Extremities:      Visualized
 RVOT:                  Previously seen        Lower Extremities:      Visualized

 Other:  Fetus appears to be a male.
Cervix Uterus Adnexa

 Cervix
 Normal appearance by transabdominal scan.

 Uterus
 No abnormality visualized.

 Right Ovary
 Not visualized.

 Left Ovary
 Not visualized.

 Cul De Sac
 No free fluid seen.

 Adnexa
 No abnormality visualized.
Comments

 This patient was seen for a follow up growth scan due to
 advanced maternal age (45 years old).  She denies any
 problems since her last exam.
 She was informed that the fetal growth measures large for
 her gestational [AGE]th percentile).  There was normal
 amniotic fluid noted.
 Due to advanced maternal age, a follow-up growth scan was
 scheduled in 8 weeks (at 32 weeks).  We will start weekly
 fetal testing at around 34 weeks.

## 2022-04-17 DIAGNOSIS — Z419 Encounter for procedure for purposes other than remedying health state, unspecified: Secondary | ICD-10-CM | POA: Diagnosis not present

## 2022-05-17 DIAGNOSIS — Z419 Encounter for procedure for purposes other than remedying health state, unspecified: Secondary | ICD-10-CM | POA: Diagnosis not present

## 2022-06-17 DIAGNOSIS — Z419 Encounter for procedure for purposes other than remedying health state, unspecified: Secondary | ICD-10-CM | POA: Diagnosis not present

## 2022-07-18 DIAGNOSIS — Z419 Encounter for procedure for purposes other than remedying health state, unspecified: Secondary | ICD-10-CM | POA: Diagnosis not present

## 2022-08-17 DIAGNOSIS — Z419 Encounter for procedure for purposes other than remedying health state, unspecified: Secondary | ICD-10-CM | POA: Diagnosis not present

## 2022-09-17 DIAGNOSIS — Z419 Encounter for procedure for purposes other than remedying health state, unspecified: Secondary | ICD-10-CM | POA: Diagnosis not present

## 2022-09-20 DIAGNOSIS — J309 Allergic rhinitis, unspecified: Secondary | ICD-10-CM | POA: Diagnosis not present

## 2022-09-20 DIAGNOSIS — J029 Acute pharyngitis, unspecified: Secondary | ICD-10-CM | POA: Diagnosis not present

## 2022-09-20 DIAGNOSIS — Z6823 Body mass index (BMI) 23.0-23.9, adult: Secondary | ICD-10-CM | POA: Diagnosis not present

## 2022-10-17 DIAGNOSIS — Z419 Encounter for procedure for purposes other than remedying health state, unspecified: Secondary | ICD-10-CM | POA: Diagnosis not present

## 2022-11-17 DIAGNOSIS — Z419 Encounter for procedure for purposes other than remedying health state, unspecified: Secondary | ICD-10-CM | POA: Diagnosis not present

## 2022-12-18 DIAGNOSIS — Z419 Encounter for procedure for purposes other than remedying health state, unspecified: Secondary | ICD-10-CM | POA: Diagnosis not present

## 2023-01-16 DIAGNOSIS — Z419 Encounter for procedure for purposes other than remedying health state, unspecified: Secondary | ICD-10-CM | POA: Diagnosis not present

## 2023-02-16 DIAGNOSIS — Z419 Encounter for procedure for purposes other than remedying health state, unspecified: Secondary | ICD-10-CM | POA: Diagnosis not present

## 2023-03-18 DIAGNOSIS — Z419 Encounter for procedure for purposes other than remedying health state, unspecified: Secondary | ICD-10-CM | POA: Diagnosis not present

## 2023-04-18 DIAGNOSIS — Z419 Encounter for procedure for purposes other than remedying health state, unspecified: Secondary | ICD-10-CM | POA: Diagnosis not present

## 2023-05-18 DIAGNOSIS — Z419 Encounter for procedure for purposes other than remedying health state, unspecified: Secondary | ICD-10-CM | POA: Diagnosis not present

## 2023-06-18 DIAGNOSIS — Z419 Encounter for procedure for purposes other than remedying health state, unspecified: Secondary | ICD-10-CM | POA: Diagnosis not present

## 2023-07-19 DIAGNOSIS — Z419 Encounter for procedure for purposes other than remedying health state, unspecified: Secondary | ICD-10-CM | POA: Diagnosis not present

## 2023-08-14 DIAGNOSIS — Z6823 Body mass index (BMI) 23.0-23.9, adult: Secondary | ICD-10-CM | POA: Diagnosis not present

## 2023-08-14 DIAGNOSIS — R030 Elevated blood-pressure reading, without diagnosis of hypertension: Secondary | ICD-10-CM | POA: Diagnosis not present

## 2023-08-14 DIAGNOSIS — R300 Dysuria: Secondary | ICD-10-CM | POA: Diagnosis not present

## 2023-08-15 ENCOUNTER — Other Ambulatory Visit: Payer: Self-pay

## 2023-08-15 ENCOUNTER — Emergency Department
Admission: EM | Admit: 2023-08-15 | Discharge: 2023-08-15 | Disposition: A | Discharge status: Home / Self Care | Payer: 59 | Attending: Emergency Medicine | Admitting: Emergency Medicine

## 2023-08-15 DIAGNOSIS — M545 Low back pain, unspecified: Secondary | ICD-10-CM | POA: Diagnosis not present

## 2023-08-15 DIAGNOSIS — N390 Urinary tract infection, site not specified: Secondary | ICD-10-CM | POA: Insufficient documentation

## 2023-08-15 DIAGNOSIS — R300 Dysuria: Secondary | ICD-10-CM | POA: Diagnosis present

## 2023-08-15 LAB — URINALYSIS, W/ REFLEX TO CULTURE (INFECTION SUSPECTED)
Bacteria, UA: NONE SEEN
Bilirubin Urine: NEGATIVE
Glucose, UA: NEGATIVE mg/dL
Ketones, ur: NEGATIVE mg/dL
Nitrite: NEGATIVE
Protein, ur: NEGATIVE mg/dL
Specific Gravity, Urine: 1.017 (ref 1.005–1.030)
pH: 6 (ref 5.0–8.0)

## 2023-08-15 LAB — CBC
HCT: 39.3 % (ref 36.0–46.0)
Hemoglobin: 12.8 g/dL (ref 12.0–15.0)
MCH: 29 pg (ref 26.0–34.0)
MCHC: 32.6 g/dL (ref 30.0–36.0)
MCV: 89.1 fL (ref 80.0–100.0)
Platelets: 264 10*3/uL (ref 150–400)
RBC: 4.41 MIL/uL (ref 3.87–5.11)
RDW: 14.3 % (ref 11.5–15.5)
WBC: 8.5 10*3/uL (ref 4.0–10.5)
nRBC: 0 % (ref 0.0–0.2)

## 2023-08-15 LAB — BASIC METABOLIC PANEL
Anion gap: 11 (ref 5–15)
BUN: 16 mg/dL (ref 6–20)
CO2: 24 mmol/L (ref 22–32)
Calcium: 9 mg/dL (ref 8.9–10.3)
Chloride: 104 mmol/L (ref 98–111)
Creatinine, Ser: 0.64 mg/dL (ref 0.44–1.00)
GFR, Estimated: >60 mL/min (ref >60)
Glucose, Bld: 105 mg/dL — ABNORMAL HIGH (ref 70–99)
Potassium: 3.3 mmol/L — ABNORMAL LOW (ref 3.5–5.1)
Sodium: 139 mmol/L (ref 135–145)

## 2023-08-15 LAB — LACTIC ACID, PLASMA: Lactic Acid, Venous: 1 mmol/L (ref 0.5–1.9)

## 2023-08-15 MED ORDER — CEPHALEXIN 500 MG PO CAPS: 500.0000 mg | ORAL_CAPSULE | Freq: Three times a day (TID) | ORAL | 0 refills | Status: AC

## 2023-08-15 MED ORDER — IBUPROFEN 600 MG PO TABS
600.0000 mg | ORAL_TABLET | Freq: Once | ORAL | Status: AC
  Administered 2023-08-15: 600 mg via ORAL
  Filled 2023-08-15: qty 1

## 2023-08-15 MED ORDER — CEPHALEXIN 500 MG PO CAPS
500.0000 mg | ORAL_CAPSULE | Freq: Once | ORAL | Status: AC
  Administered 2023-08-15: 500 mg via ORAL
  Filled 2023-08-15: qty 1

## 2023-08-15 NOTE — ED Provider Notes (Signed)
[facility] Provider Note    Event Date/Time   First MD Initiated Contact with Patient 08/15/23 2055     (approximate)   History   Flank Pain   HPI  Lauren Mann is a 47 y.o. female who presents to the emergency department today because of concerns for back pain.  The patient states a few days ago she started having some dysuria.  She started taking Azo over-the-counter.  Went to her doctor yesterday who prescribed Macrobid.  Patient is now complaining of pain in the lower back.  It goes across her whole lower back.  Perhaps slightly worse on the right side.  Patient denies any fevers.  States she has had 1 UTI in the past number of years ago.     Physical Exam   Triage Vital Signs: ED Triage Vitals  Encounter Vitals Group     BP 08/15/23 1948 (!) 152/90     Systolic BP Percentile --      Diastolic BP Percentile --      Pulse Rate 08/15/23 1948 73     Resp 08/15/23 1948 17     Temp 08/15/23 1948 98.3 F (36.8 C)     Temp Source 08/15/23 1948 Oral     SpO2 08/15/23 1948 97 %     Weight --      Height --      Head Circumference --      Peak Flow --      Pain Score 08/15/23 1949 4     Pain Loc --      Pain Education --      Exclude from Growth Chart --     Most recent vital signs: Vitals:   08/15/23 1948  BP: (!) 152/90  Pulse: 73  Resp: 17  Temp: 98.3 F (36.8 C)  SpO2: 97%   General: Awake, alert, oriented. CV:  Good peripheral perfusion. Regular rate and rhythm. Resp:  Normal effort. Lungs clear. Abd:  No distention. No abdominal tenderness. Mild right sided CVA tenderness.   ED Results / Procedures / Treatments   Labs (all labs ordered are listed, but only abnormal results are displayed) Labs Reviewed  BASIC METABOLIC PANEL - Abnormal; Notable for the following components:      Result Value   Potassium 3.3 (*)    Glucose, Bld 105 (*)    All other components within normal limits  URINALYSIS, W/ REFLEX TO CULTURE (INFECTION  SUSPECTED) - Abnormal; Notable for the following components:   Color, Urine YELLOW (*)    APPearance HAZY (*)    Hgb urine dipstick MODERATE (*)    Leukocytes,Ua SMALL (*)    All other components within normal limits  CBC  LACTIC ACID, PLASMA  LACTIC ACID, PLASMA     EKG  None   RADIOLOGY None  PROCEDURES:  Critical Care performed: No  MEDICATIONS ORDERED IN ED: Medications - No data to display   IMPRESSION / MDM / ASSESSMENT AND PLAN / ED COURSE  I reviewed the triage vital signs and the nursing notes.                              Differential diagnosis includes, but is not limited to, UTI, pyelo, gastroenteritis  Patient's presentation is most consistent with acute presentation with potential threat to life or bodily function.  Patient presented to the emergency department today because of concerns for back pain  in setting of likely UTI.  On exam patient with very mild right-sided CVA tenderness.  Patient is afebrile here.  Blood work without concerning leukocytosis.  UA does show signs of infection. Will plan on starting patient on keflex to replace macrobid.     FINAL CLINICAL IMPRESSION(S) / ED DIAGNOSES   Final diagnoses:  Lower urinary tract infectious disease    Note:  This document was prepared using Dragon voice recognition software and may include unintentional dictation errors.    Phineas Semen, MD 08/15/23 [phone]

## 2023-08-15 NOTE — ED Triage Notes (Addendum)
[facility] for [facility], pt states she's been taking the Macrobid for 3 days w/ improvement of dysuria but c/o bilateral flank pain since 2 days ago concerning for pylonephritis that got worse this morning.  Pt has taken tylenol w/ some relief. Pt AOX4, NAD noted. Pt denies N/V/D.

## 2023-08-15 NOTE — Discharge Instructions (Signed)
Please seek medical attention for any high fevers, chest pain, shortness of breath, change in behavior, persistent vomiting, bloody stool or any other new or concerning symptoms.  

## 2023-08-18 DIAGNOSIS — Z419 Encounter for procedure for purposes other than remedying health state, unspecified: Secondary | ICD-10-CM | POA: Diagnosis not present
